# Patient Record
Sex: Male | Born: 2000 | Hispanic: Yes | Marital: Single | Smoking: Former smoker
Health system: Southern US, Community
[De-identification: ages and names within clinical notes are randomized; demographics above are authoritative.]

## PROBLEM LIST (undated history)

## (undated) DIAGNOSIS — T443X1A Poisoning by other parasympatholytics [anticholinergics and antimuscarinics] and spasmolytics, accidental (unintentional), initial encounter: Secondary | ICD-10-CM

## (undated) DIAGNOSIS — F419 Anxiety disorder, unspecified: Secondary | ICD-10-CM

## (undated) DIAGNOSIS — K219 Gastro-esophageal reflux disease without esophagitis: Secondary | ICD-10-CM

## (undated) HISTORY — PX: NO PAST SURGERIES: SHX2092

## (undated) HISTORY — PX: WISDOM TOOTH EXTRACTION: SHX21

## (undated) HISTORY — DX: Anxiety disorder, unspecified: F41.9

---
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
## 2020-07-21 IMAGING — CT CT HEAD W/O CM
2 series · 15 of 30 positions shown, 17 images · non-contrast
Comparison: None.

CLINICAL DATA: Patient with history of seizure.

EXAM:
CT HEAD WITHOUT CONTRAST
TECHNIQUE: Contiguous axial images were obtained from the base of the skull
through the vertex without intravenous contrast.

[Series 3: head wo · axial · 0.44mm/px · z∈[-147,-22]mm · 7 of 35 slices shown, 9 images]
[im 5/35  brain]
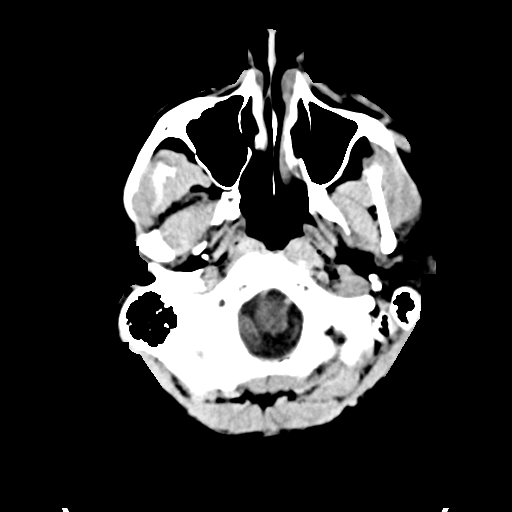
[im 5/35  bone]
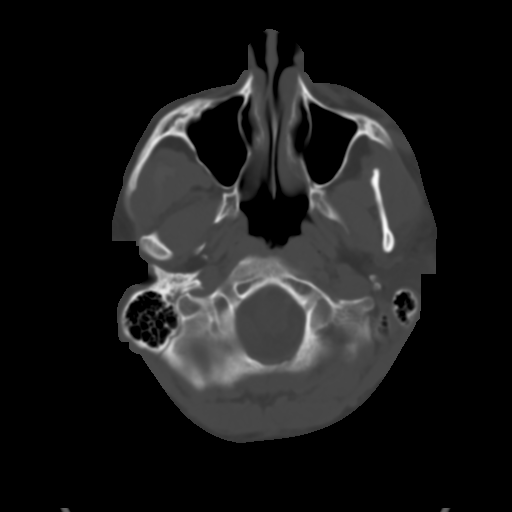
[im 9/35  brain]
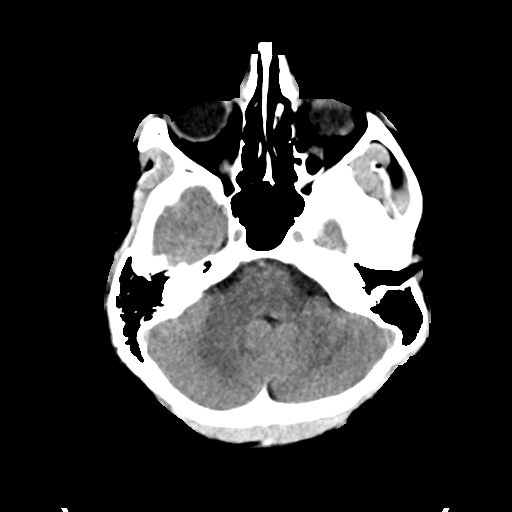
[im 13/35  brain]
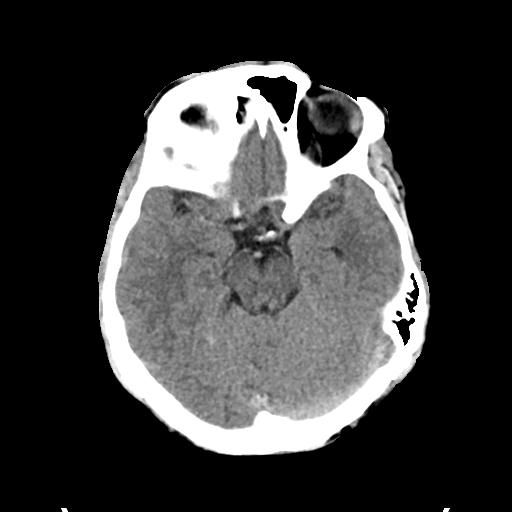
[im 18/35  brain]
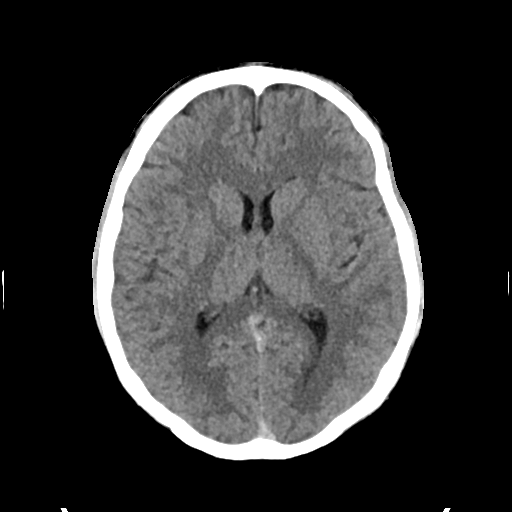
[im 22/35  brain]
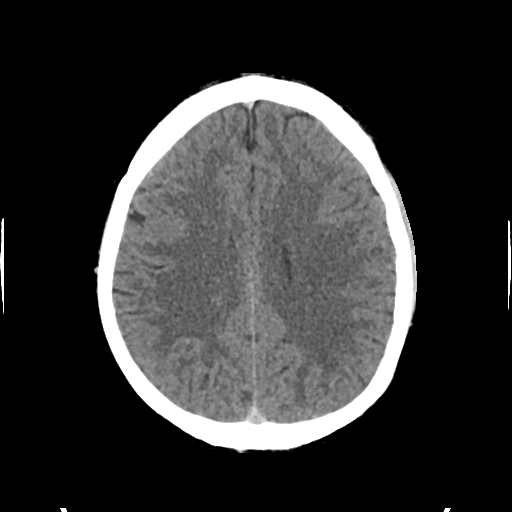
[im 22/35  bone]
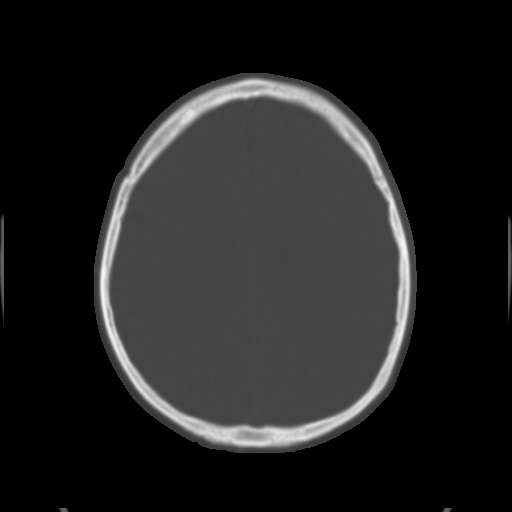
[im 26/35  brain]
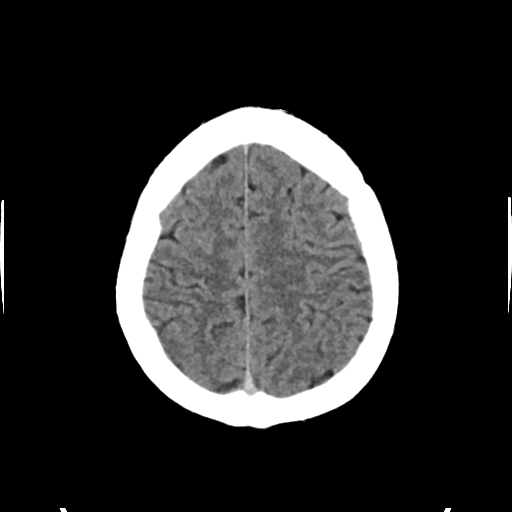
[im 30/35  brain]
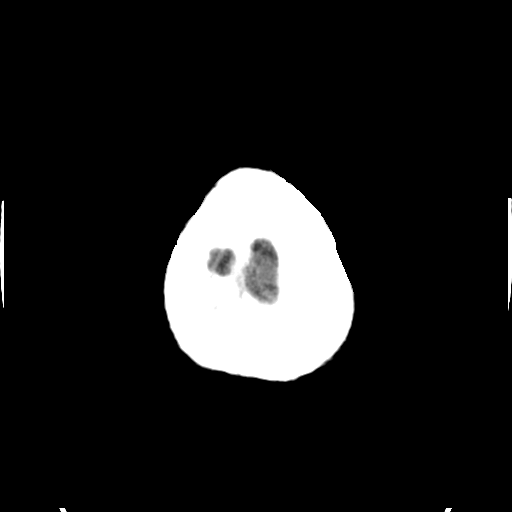

[Series 4: head bone · axial · 0.44mm/px · z∈[-151,-15]mm · 8 of 86 slices shown]
[im 9/86  bone]
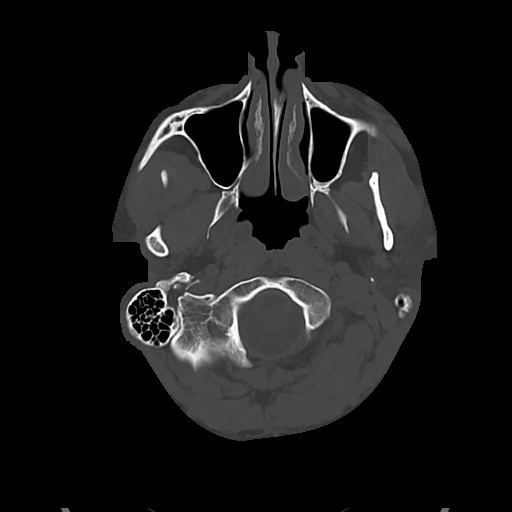
[im 18/86  bone]
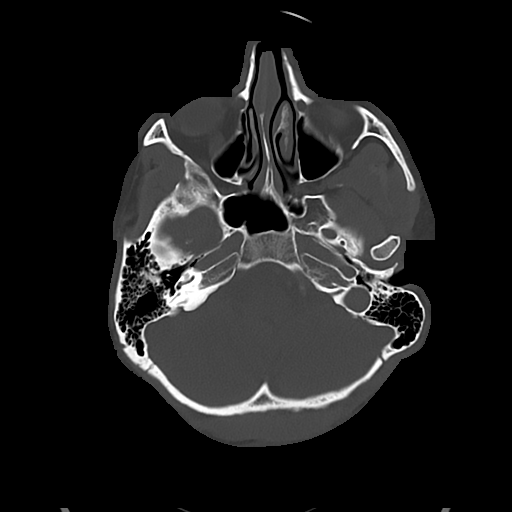
[im 26/86  bone]
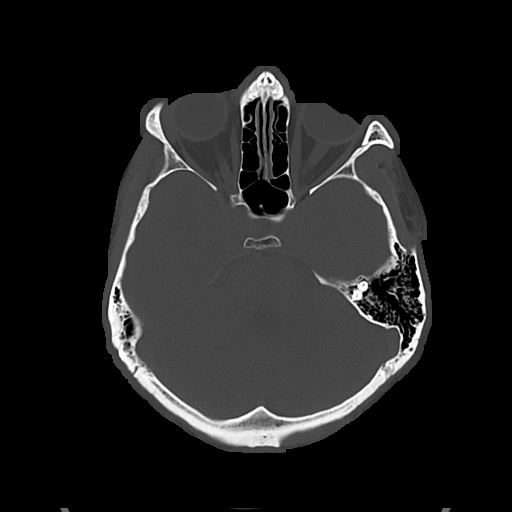
[im 39/86  bone]
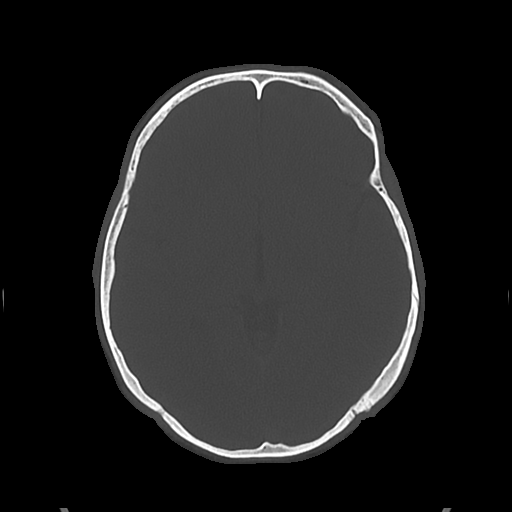
[im 47/86  bone]
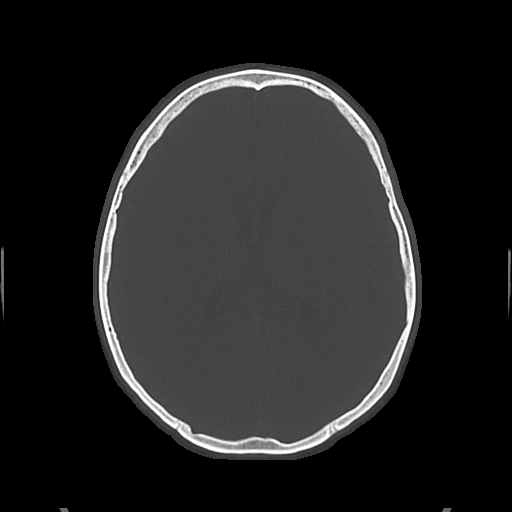
[im 60/86  bone]
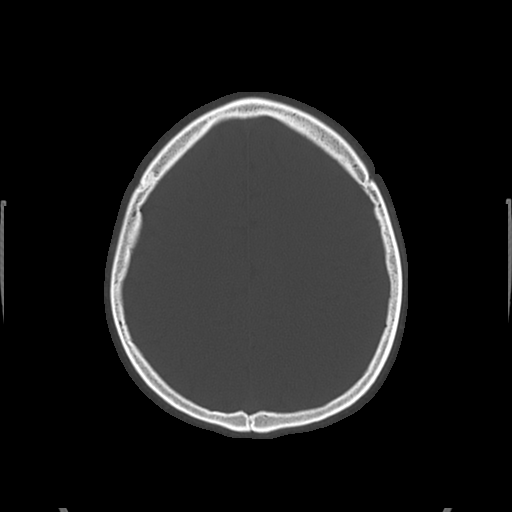
[im 69/86  bone]
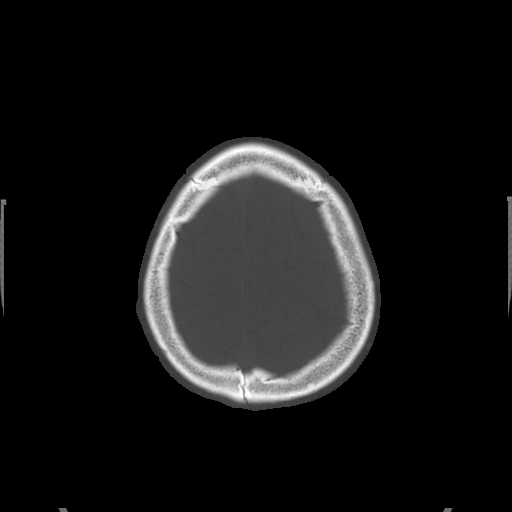
[im 77/86  bone]
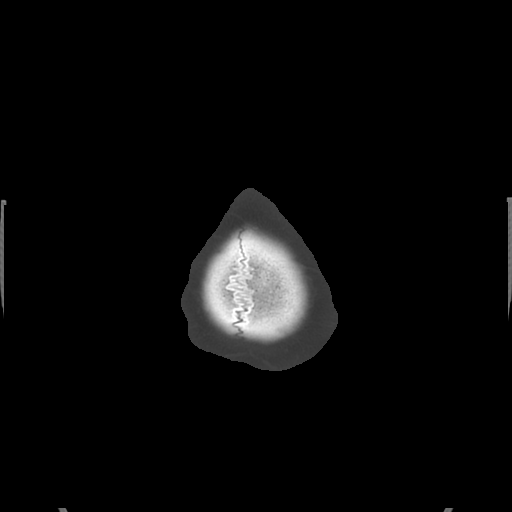

[15 of 30 positions shown; findings below may reference images not displayed]

FINDINGS: Brain: Ventricles and sulci are appropriate for patient's age. No
evidence for acute cortically based infarct, intracranial
hemorrhage, mass lesion or mass-effect.

Vascular: Unremarkable

Skull: Intact.

Sinuses/Orbits: Paranasal sinuses are well aerated. Mastoid air
cells are unremarkable. Orbits are unremarkable.

Other: None.
IMPRESSION: No acute intracranial process.

## 2020-07-21 IMAGING — MR MR HEAD W/O CM
11 of 12 series · 43 of 48 positions shown · non-contrast
Comparison: Head CT same day.

CLINICAL DATA: Seizure.  Confusion and agitation.

EXAM:
MRI HEAD WITHOUT CONTRAST
TECHNIQUE: Multiplanar, multiecho pulse sequences of the brain and surrounding
structures were obtained without intravenous contrast.

[Series 5: DWI · axial · 3.0mm · 0.88mm/px · z∈[-146,-3]mm · 4 of 49 slices shown (1 of 4)]
[im 1/49]
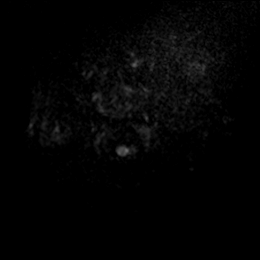
[im 17/49]
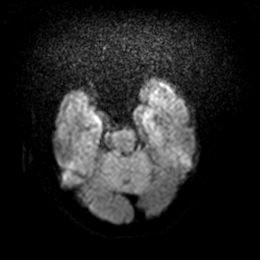
[im 33/49]
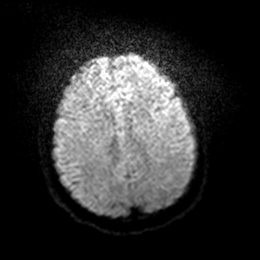
[im 49/49]
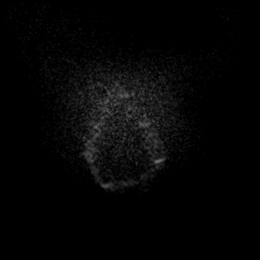

[Series 6: DWI · axial · 3.0mm · 0.88mm/px · z∈[-146,-3]mm · 5 of 49 slices shown (2 of 4)]
[im 1/49]
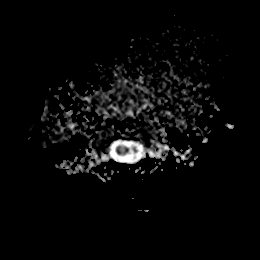
[im 13/49]
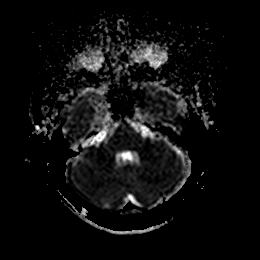
[im 25/49]
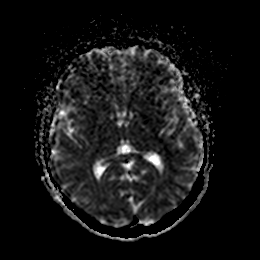
[im 37/49]
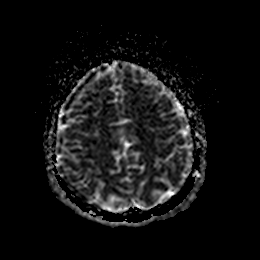
[im 49/49]
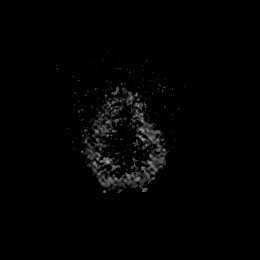

[Series 7: DWI · coronal · 4.0mm · 0.88mm/px · 4 of 35 slices shown (3 of 4)]
[im 1/35]
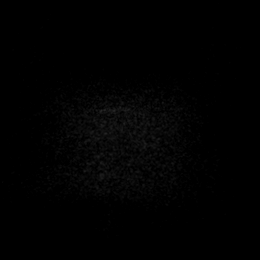
[im 12/35]
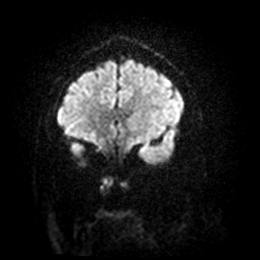
[im 23/35]
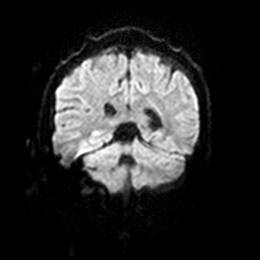
[im 35/35]
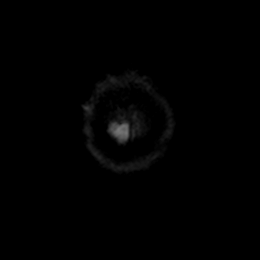

[Series 8: DWI · coronal · 4.0mm · 0.88mm/px · 4 of 35 slices shown (4 of 4)]
[im 1/35]
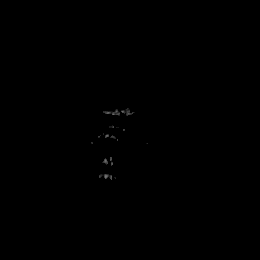
[im 12/35]
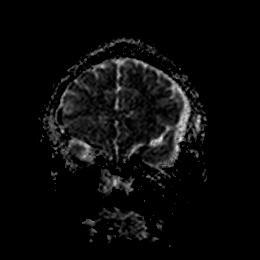
[im 23/35]
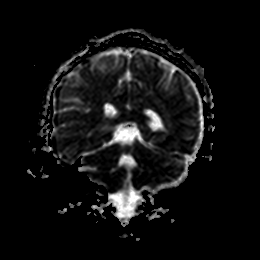
[im 35/35]
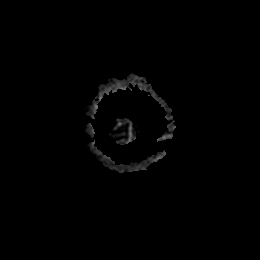

[Series 9: T1 · sagittal · 5.0mm · 0.75mm/px · 2 of 23 slices shown]
[im 1/23]
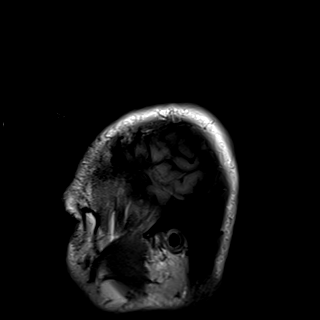
[im 23/23]
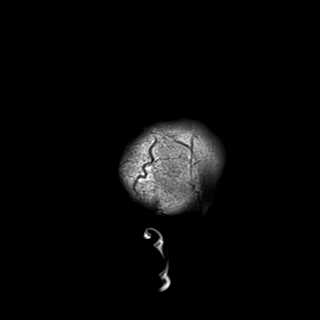

[Series 10: T2 · axial · 5.0mm · 0.72mm/px · z∈[-146,-3]mm · 3 of 25 slices shown (1 of 2)]
[im 1/25]
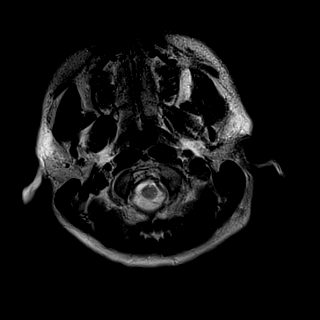
[im 13/25]
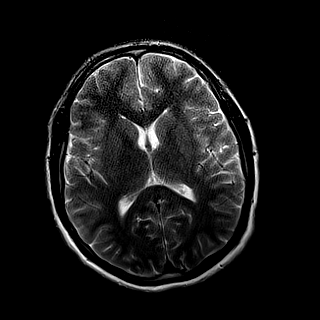
[im 25/25]
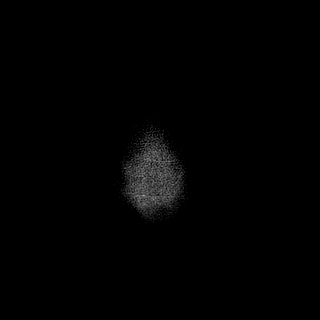

[Series 11: FLAIR · axial · 5.0mm · 0.45mm/px · z∈[-146,-3]mm · 3 of 25 slices shown]
[im 1/25]
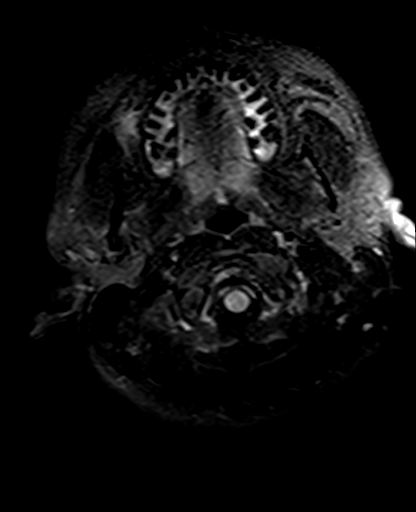
[im 13/25]
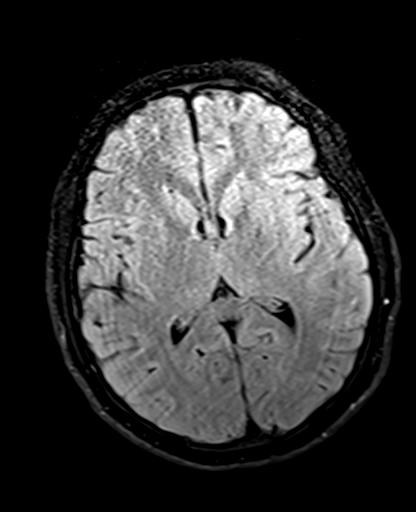
[im 25/25]
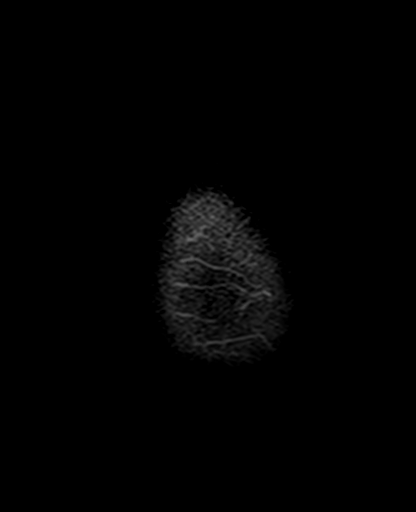

[Series 12: mag_images · axial · 3.0mm · 0.90mm/px · z∈[-151,+1]mm · 5 of 52 slices shown]
[im 1/52]
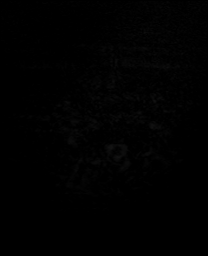
[im 13/52]
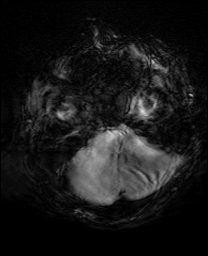
[im 26/52]
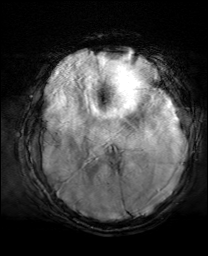
[im 39/52]
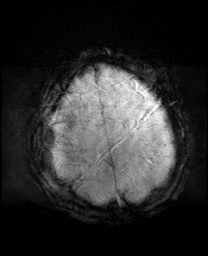
[im 52/52]
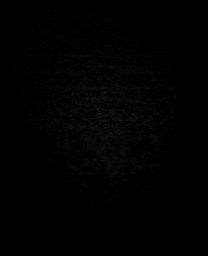

[Series 14: swi_images · axial · 3.0mm · 0.90mm/px · z∈[-151,+1]mm · 5 of 52 slices shown]
[im 1/52]
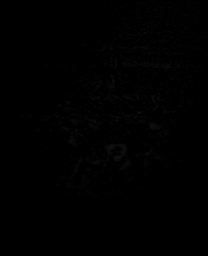
[im 13/52]
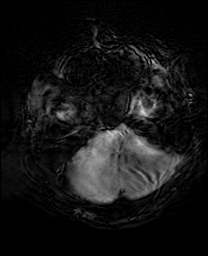
[im 26/52]
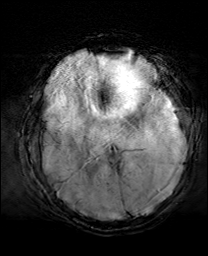
[im 39/52]
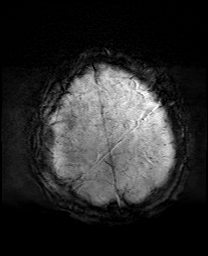
[im 52/52]
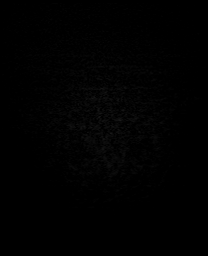

[Series 15: mip_images(sw) · axial · 24.0mm · 0.90mm/px · z∈[-140,-9]mm · 5 of 45 slices shown]
[im 1/45]
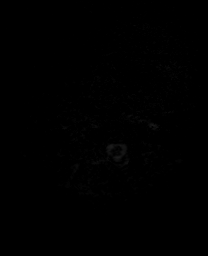
[im 12/45]
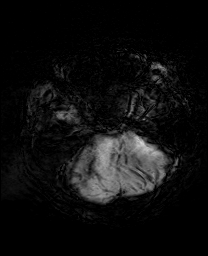
[im 23/45]
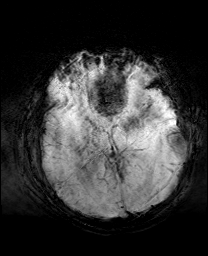
[im 34/45]
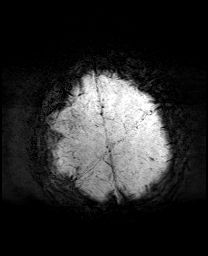
[im 45/45]
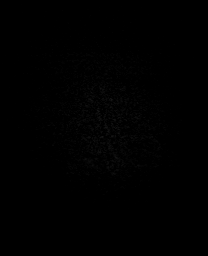

[Series 16: T2 · axial · 5.0mm · 0.90mm/px · z∈[-144,-1]mm · 3 of 25 slices shown (2 of 2)]
[im 1/25]
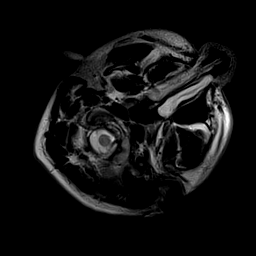
[im 13/25]
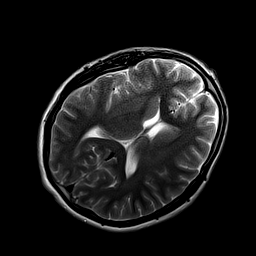
[im 25/25]
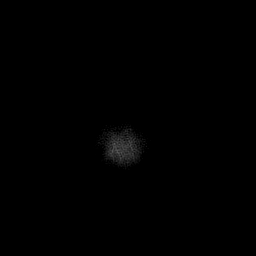

[43 of 48 positions shown; findings below may reference images not displayed]

FINDINGS: Brain: The study suffers from motion degradation. Diffusion imaging
does not show any acute or subacute infarction or other cause of
restricted diffusion. The brain appears normally formed. No sign of
infarction, mass, hemorrhage, hydrocephalus or extra-axial
collection.

Vascular: Major vessels at the base of the brain show flow.

Skull and upper cervical spine: Negative

Sinuses/Orbits: Clear/normal

Other: None
IMPRESSION: Markedly motion degraded exam.  No abnormality visible.

## 2022-12-14 ENCOUNTER — Emergency Department (HOSPITAL_COMMUNITY)
Admission: EM | Admit: 2022-12-14 | Discharge: 2022-12-14 | Disposition: A | Discharge status: Home / Self Care | Payer: Medicaid Other | Attending: Emergency Medicine | Admitting: Emergency Medicine

## 2022-12-14 ENCOUNTER — Encounter (HOSPITAL_COMMUNITY): Payer: Self-pay

## 2022-12-14 ENCOUNTER — Other Ambulatory Visit: Payer: Self-pay

## 2022-12-14 DIAGNOSIS — M549 Dorsalgia, unspecified: Secondary | ICD-10-CM | POA: Diagnosis present

## 2022-12-14 DIAGNOSIS — G8929 Other chronic pain: Secondary | ICD-10-CM | POA: Insufficient documentation

## 2022-12-14 DIAGNOSIS — M546 Pain in thoracic spine: Secondary | ICD-10-CM | POA: Diagnosis not present

## 2022-12-14 MED ORDER — NAPROXEN 500 MG PO TABS: 500.0000 mg | ORAL_TABLET | Freq: Two times a day (BID) | ORAL | 0 refills | Status: DC

## 2022-12-14 MED ORDER — KETOROLAC TROMETHAMINE 15 MG/ML IJ SOLN
15.0000 mg | Freq: Once | INTRAMUSCULAR | Status: AC
  Administered 2022-12-14: 15 mg via INTRAMUSCULAR
  Filled 2022-12-14: qty 1

## 2022-12-14 MED ORDER — LIDOCAINE 5 % EX PTCH
1.0000 | MEDICATED_PATCH | CUTANEOUS | Status: DC
  Administered 2022-12-14: 1 via TRANSDERMAL
  Filled 2022-12-14: qty 1

## 2022-12-14 MED ORDER — CYCLOBENZAPRINE HCL 10 MG PO TABS: 10.0000 mg | ORAL_TABLET | Freq: Two times a day (BID) | ORAL | 0 refills | Status: DC | PRN

## 2022-12-14 NOTE — ED Provider Notes (Signed)
[place] EMERGENCY DEPARTMENT AT [facility] Provider Note   CSN: [id number] Arrival date & time: 12/14/22  0820     History  Chief Complaint  Patient presents with   Back Pain   Spasms    Kevin Schultz is a 22 y.o. male history of anxiety presents today for evaluation of back pain.  Pain is located in the mid back and radiates to the front below the rib cage.  States had a symptom has been going on for a couple months, usually once or twice a week.  Patient stated he has been trying Tylenol and ibuprofen with minimal relief.  He denies any fever, nausea vomiting, chest pain, shortness of breath, urinary retention, bowel incontinence, saddle anesthesia, distal weakness.  Denies IVDU.  Denies any hematuria.  States he used to work at [employer] where he had to do some heavy lifting but has quit that job for a few months now.  Denies any recent fall or trauma.   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 Authorizing Provider  escitalopram (LEXAPRO) 10 MG tablet Take 1 tablet (10 mg total) by mouth daily. 12/08/18   [name], NP  neomycin-polymyxin-hydrocortisone (CORTISPORIN) OTIC solution Apply 1-2 drops to toe after soaking twice a day 01/12/19   Lenn Sink, DPM      Allergies    Patient has no known allergies.    Review of Systems   Review of Systems  Musculoskeletal:  Positive for back pain.    Physical Exam Updated Vital Signs BP (!) 153/80 (BP Location: Right Arm)   Pulse 95   Temp 98.1 F (36.7 C)   Resp 16   Ht 5\' 5"  (1.651 m)   Wt 93 kg   SpO2 100%   BMI 34.11 kg/m  Physical Exam Vitals and nursing note reviewed.  Constitutional:      Appearance: Normal appearance.   HENT:     Head: Normocephalic and atraumatic.     Mouth/Throat:     Mouth: Mucous membranes are moist.  Eyes:     General: No scleral icterus. Cardiovascular:     Rate and Rhythm: Normal rate and regular rhythm.     Pulses: Normal pulses.     Heart sounds: Normal heart sounds.  Pulmonary:     Effort: Pulmonary effort is normal.     Breath sounds: Normal breath sounds.  Abdominal:     General: Abdomen is flat.     Palpations: Abdomen is soft.     Tenderness: There is no abdominal tenderness.  Musculoskeletal:        General: No deformity.     Comments: Tenderness to palpation to the paraspinal muscles of the thoracic spine.  No midline tenderness.  No abdominal tenderness.  Negative CVA tenderness.  Skin:    General: Skin is warm.     Findings: No rash.  Neurological:     General: No focal deficit present.     Mental Status: He is alert.  Psychiatric:        Mood and Affect: Mood normal.     ED Results / Procedures / Treatments   Labs (all labs ordered are listed,  but only abnormal results are displayed) Labs Reviewed - No data to display  EKG None  Radiology No results found.  Procedures Procedures    Medications Ordered in ED Medications - No data to display  ED Course/ Medical Decision Making/ A&P                                 Medical Decision Making  This patient presents to the ED for back pain, this involves an extensive number of treatment options, and is a complaint that carries with a high risk of complications and morbidity.  The differential diagnosis includes fracture, dislocation, musculoskeletal pain,.  This is not an exhaustive list.  Problem list/ ED course/ Critical interventions/ Medical management: HPI: See above Vital signs within normal range and stable throughout visit. Laboratory/imaging studies significant for: See above. On physical examination, patient is afebrile and appears in no acute distress. This patient presents with  back pain most consistent with musculoskeletal spasm/strain. No back pain red flags on history or physical. Presentation not consistent with malignancy (lack of history of malignancy, lack of B symptoms), fracture (no trauma, no bony tenderness to palpation), transverse myelitis, (no sensory loss, no distal weakness), thoracic aortic dissection (equal peripheral pulses, no tachycardia, story does not fit), pneumonia (afebrile, no infectious symptoms), pulmonary embolism (patient is PERC negative), osteomyelitis or epidural abscess (no IVDU, vertebral tenderness). Given the clinical picture, no indication for imaging at this time.  Given Toradol and lidocaine patch for pain. Advised patient to take Tylenol/ibuprofen/naproxen for pain, follow-up with primary care physician for further evaluation and management, return to the ER if new or worsening symptoms. I have reviewed the patient home medicines and have made adjustments as needed.  Cardiac monitoring/EKG: The patient was maintained on a cardiac monitor.  I personally reviewed and interpreted the cardiac monitor which showed an underlying rhythm of: sinus rhythm.  Additional history obtained: External records from outside source obtained and reviewed including: Chart review including previous notes, labs, imaging.  Consultations obtained:  Disposition Continued outpatient therapy. Follow-up with PCP recommended for reevaluation of symptoms. Treatment plan discussed with patient.  Pt acknowledged understanding was agreeable to the plan. Worrisome signs and symptoms were discussed with patient, and patient acknowledged understanding to return to the ED if they noticed these signs and symptoms. Patient was stable upon discharge.   This chart was dictated using voice recognition software.  Despite best efforts to proofread,  errors can occur which can change the documentation meaning.          Final Clinical Impression(s) / ED Diagnoses Final  diagnoses:  Chronic bilateral thoracic back pain    Rx / DC Orders ED Discharge Orders          Ordered    naproxen (NAPROSYN) 500 MG tablet  2 times daily        12/14/22 0909    cyclobenzaprine (FLEXERIL) 10 MG tablet  2 times daily PRN        12/14/22 0909              Jeanelle Malling, PA 12/14/22 5284    Rolan Bucco, MD 12/15/22 [phone]

## 2022-12-14 NOTE — ED Triage Notes (Addendum)
Pt arrived POV from home c/o mid/lower back pain that wraps around both sides to his rib cage that has been on going for 6 hrs. Pt states this happens once a week and needs to be checked. Pt states he feels like he is having spasms.

## 2022-12-14 NOTE — Discharge Instructions (Addendum)
Please take your medications as prescribed.  Please avoid driving after taking flexeril as it can cause drowsiness.  Take tylenol/naproxen every 6 hour for pain. You can also use ice/heat for symptom relief. I recommend close follow-up with PCP for reevaluation.  Please do not hesitate to return to emergency department if worrisome signs symptoms we discussed become apparent.

## 2023-02-07 ENCOUNTER — Other Ambulatory Visit: Payer: Self-pay

## 2023-02-07 ENCOUNTER — Emergency Department (HOSPITAL_COMMUNITY)
Admission: EM | Admit: 2023-02-07 | Discharge: 2023-02-08 | Disposition: A | Discharge status: Home / Self Care | Payer: Medicaid Other | Attending: Emergency Medicine | Admitting: Emergency Medicine

## 2023-02-07 ENCOUNTER — Encounter (HOSPITAL_COMMUNITY): Payer: Self-pay | Admitting: *Deleted

## 2023-02-07 DIAGNOSIS — K226 Gastro-esophageal laceration-hemorrhage syndrome: Secondary | ICD-10-CM | POA: Diagnosis not present

## 2023-02-07 DIAGNOSIS — R112 Nausea with vomiting, unspecified: Secondary | ICD-10-CM

## 2023-02-07 DIAGNOSIS — K920 Hematemesis: Secondary | ICD-10-CM | POA: Diagnosis present

## 2023-02-07 LAB — ETHANOL: Alcohol, Ethyl (B): 242 mg/dL — ABNORMAL HIGH (ref <10)

## 2023-02-07 LAB — URINALYSIS, ROUTINE W REFLEX MICROSCOPIC
Bilirubin Urine: NEGATIVE
Glucose, UA: NEGATIVE mg/dL
Hgb urine dipstick: NEGATIVE
Ketones, ur: NEGATIVE mg/dL
Leukocytes,Ua: NEGATIVE
Nitrite: NEGATIVE
Protein, ur: NEGATIVE mg/dL
Specific Gravity, Urine: 1.003 — ABNORMAL LOW (ref 1.005–1.030)
pH: 6 (ref 5.0–8.0)

## 2023-02-07 LAB — CBC
HCT: 44.2 % (ref 39.0–52.0)
Hemoglobin: 15.1 g/dL (ref 13.0–17.0)
MCH: 32 pg (ref 26.0–34.0)
MCHC: 34.2 g/dL (ref 30.0–36.0)
MCV: 93.6 fL (ref 80.0–100.0)
Platelets: 406 10*3/uL — ABNORMAL HIGH (ref 150–400)
RBC: 4.72 MIL/uL (ref 4.22–5.81)
RDW: 12.5 % (ref 11.5–15.5)
WBC: 14 10*3/uL — ABNORMAL HIGH (ref 4.0–10.5)
nRBC: 0 % (ref 0.0–0.2)

## 2023-02-07 LAB — COMPREHENSIVE METABOLIC PANEL
ALT: 14 U/L (ref 0–44)
AST: 21 U/L (ref 15–41)
Albumin: 4.7 g/dL (ref 3.5–5.0)
Alkaline Phosphatase: 85 U/L (ref 38–126)
Anion gap: 10 (ref 5–15)
BUN: 9 mg/dL (ref 6–20)
CO2: 26 mmol/L (ref 22–32)
Calcium: 9 mg/dL (ref 8.9–10.3)
Chloride: 100 mmol/L (ref 98–111)
Creatinine, Ser: 1.05 mg/dL (ref 0.61–1.24)
GFR, Estimated: >60 mL/min (ref >60)
Glucose, Bld: 110 mg/dL — ABNORMAL HIGH (ref 70–99)
Potassium: 4.1 mmol/L (ref 3.5–5.1)
Sodium: 136 mmol/L (ref 135–145)
Total Bilirubin: 0.4 mg/dL (ref 0.3–1.2)
Total Protein: 7.8 g/dL (ref 6.5–8.1)

## 2023-02-07 LAB — LIPASE, BLOOD: Lipase: 24 U/L (ref 11–51)

## 2023-02-07 MED ORDER — ONDANSETRON HCL 4 MG/2ML IJ SOLN
4.0000 mg | Freq: Once | INTRAMUSCULAR | Status: AC
  Administered 2023-02-08: 4 mg via INTRAVENOUS
  Filled 2023-02-07: qty 2

## 2023-02-07 MED ORDER — PANTOPRAZOLE SODIUM 40 MG IV SOLR
40.0000 mg | Freq: Once | INTRAVENOUS | Status: AC
  Administered 2023-02-08: 40 mg via INTRAVENOUS
  Filled 2023-02-07: qty 10

## 2023-02-07 NOTE — ED Provider Notes (Signed)
[name] EMERGENCY DEPARTMENT AT [facility] Provider Note   CSN: [id number] Arrival date & time: 02/07/23  2156     History {Add pertinent medical, surgical, social history, OB history to HPI:1} Chief Complaint  Patient presents with   vomited blood    Kevin Schultz is a 22 y.o. male.  Reports vomiting after drinking alcohol.  Patient reports that there was red blood in his vomit.  He reports that he has had multiple episodes of vomiting throughout the day.  No melanotic stools.  He is not experiencing any abdominal pain.  Patient denies daily alcohol use but he has been drinking heavily for the last 3 or 4 days.       Home Medications Prior to Admission medications   Medication Sig Start Date End Date Taking? Authorizing Provider  cyclobenzaprine (FLEXERIL) 10 MG tablet Take 1 tablet (10 mg total) by mouth 2 (two) times daily as needed for muscle spasms. 12/14/22   Jeanelle Malling, PA  escitalopram (LEXAPRO) 10 MG tablet Take 1 tablet (10 mg total) by mouth daily. 12/08/18   [name], NP  naproxen (NAPROSYN) 500 MG tablet Take 1 tablet (500 mg total) by mouth 2 (two) times daily. 12/14/22   Jeanelle Malling, PA  neomycin-polymyxin-hydrocortisone (CORTISPORIN) OTIC solution Apply 1-2 drops to toe after soaking twice a day 01/12/19   Lenn Sink, DPM      Allergies    Patient has no known allergies.    Review of Systems   Review of Systems  Physical Exam Updated Vital Signs BP 128/84 (BP Location: Right Arm)   Pulse (!) 112   Temp 98.6 F (37 C) (Oral)   Resp 20   Ht 5\' 5"  (1.651 m)   Wt 93 kg   SpO2 100%   BMI 34.12 kg/m  Physical Exam Vitals and nursing note reviewed.  Constitutional:      General: He is not in acute distress.    Appearance: He is well-developed.  HENT:     Head: Normocephalic and atraumatic.     Mouth/Throat:     Mouth: Mucous membranes are moist.  Eyes:     General: Vision grossly intact. Gaze aligned appropriately.      Extraocular Movements: Extraocular movements intact.     Conjunctiva/sclera: Conjunctivae normal.  Cardiovascular:     Rate and Rhythm: Regular rhythm. Tachycardia present.     Pulses: Normal pulses.     Heart sounds: Normal heart sounds, S1 normal and S2 normal. No murmur heard.    No friction rub. No gallop.  Pulmonary:     Effort: Pulmonary effort is normal. No respiratory distress.     Breath sounds: Normal breath sounds.  Abdominal:     Palpations: Abdomen is soft.     Tenderness: There is no abdominal tenderness. There is no guarding or rebound.     Hernia: No hernia is present.  Musculoskeletal:        General: No swelling.     Cervical back: Full passive range of motion without pain, normal range of motion and neck supple. No pain with movement, spinous process tenderness or muscular tenderness. Normal range of motion.     Right lower leg: No edema.     Left lower leg: No edema.  Skin:    General: Skin is warm and dry.     Capillary Refill: Capillary refill takes less than 2 seconds.     Findings: No ecchymosis, erythema, lesion or wound.  Neurological:  Mental Status: He is alert and oriented to person, place, and time.     GCS: GCS eye subscore is 4. GCS verbal subscore is 5. GCS motor subscore is 6.     Cranial Nerves: Cranial nerves 2-12 are intact.     Sensory: Sensation is intact.     Motor: Motor function is intact. No weakness or abnormal muscle tone.     Coordination: Coordination is intact.  Psychiatric:        Mood and Affect: Mood normal.        Speech: Speech normal.        Behavior: Behavior normal.     ED Results / Procedures / Treatments   Labs (all labs ordered are listed, but only abnormal results are displayed) Labs Reviewed  COMPREHENSIVE METABOLIC PANEL - Abnormal; Notable for the following components:      Result Value   Glucose, Bld 110 (*)    All other components within normal limits  CBC - Abnormal; Notable for the following components:    WBC 14.0 (*)    Platelets 406 (*)    All other components within normal limits  URINALYSIS, ROUTINE W REFLEX MICROSCOPIC - Abnormal; Notable for the following components:   Color, Urine COLORLESS (*)    Specific Gravity, Urine 1.003 (*)    All other components within normal limits  ETHANOL - Abnormal; Notable for the following components:   Alcohol, Ethyl (B) 242 (*)    All other components within normal limits  LIPASE, BLOOD    EKG EKG Interpretation Date/Time:  Saturday February 07 2023 22:12:48 EDT Ventricular Rate:  101 PR Interval:  146 QRS Duration:  88 QT Interval:  302 QTC Calculation: 391 R Axis:   66  Text Interpretation: Sinus tachycardia Minimal voltage criteria for LVH, may be normal variant ( R in aVL ) Possible Inferior infarct , age undetermined Cannot rule out Anterior infarct , age undetermined Abnormal ECG When compared with ECG of 14-May-2019 05:05, No acute changes Confirmed by Gilda Crease [phone]) on 02/07/2023 11:38:58 PM  Radiology No results found.  Procedures Procedures  {Document cardiac monitor, telemetry assessment procedure when appropriate:1}  Medications Ordered in ED Medications - No data to display  ED Course/ Medical Decision Making/ A&P   {   Click here for ABCD2, HEART and other calculatorsREFRESH Note before signing :1}                              Medical Decision Making Risk Prescription drug management.   ***  {Document critical care time when appropriate:1} {Document review of labs and clinical decision tools ie heart score, Chads2Vasc2 etc:1}  {Document your independent review of radiology images, and any outside records:1} {Document your discussion with family members, caretakers, and with consultants:1} {Document social determinants of health affecting pt's care:1} {Document your decision making why or why not admission, treatments were needed:1} Final Clinical Impression(s) / ED Diagnoses Final diagnoses:   None    Rx / DC Orders ED Discharge Orders     None

## 2023-02-07 NOTE — ED Triage Notes (Signed)
He had his  last alcohol one hour ago

## 2023-02-07 NOTE — ED Triage Notes (Signed)
Earlier today  he vomited blood after drinking alcohol and he has been having back pain which he takes a muscle relaxer

## 2023-02-07 NOTE — ED Notes (Signed)
ED Provider at bedside. 

## 2023-02-08 MED ORDER — ONDANSETRON 4 MG PO TBDP: ORAL_TABLET | ORAL | 0 refills | Status: DC

## 2023-02-08 MED ORDER — FAMOTIDINE 20 MG PO TABS: 20.0000 mg | ORAL_TABLET | Freq: Two times a day (BID) | ORAL | 0 refills | Status: DC

## 2023-02-08 NOTE — ED Notes (Signed)
Pt more awake and arousable. Preparing pt for d/c.

## 2023-02-08 NOTE — Discharge Instructions (Signed)
Avoid alcohol.  Take the medications as prescribed.  Return for worsening symptoms.

## 2023-04-06 ENCOUNTER — Emergency Department (HOSPITAL_COMMUNITY)
Admission: EM | Admit: 2023-04-06 | Discharge: 2023-04-06 | Disposition: A | Discharge status: Home / Self Care | Payer: Medicaid Other | Attending: Emergency Medicine | Admitting: Emergency Medicine

## 2023-04-06 ENCOUNTER — Encounter (HOSPITAL_COMMUNITY): Payer: Self-pay

## 2023-04-06 ENCOUNTER — Other Ambulatory Visit: Payer: Self-pay

## 2023-04-06 DIAGNOSIS — M62830 Muscle spasm of back: Secondary | ICD-10-CM | POA: Insufficient documentation

## 2023-04-06 MED ORDER — LIDOCAINE 5 % EX PTCH
1.0000 | MEDICATED_PATCH | CUTANEOUS | Status: DC
  Administered 2023-04-06: 1 via TRANSDERMAL
  Filled 2023-04-06: qty 1

## 2023-04-06 MED ORDER — KETOROLAC TROMETHAMINE 15 MG/ML IJ SOLN
15.0000 mg | Freq: Once | INTRAMUSCULAR | Status: AC
  Administered 2023-04-06: 15 mg via INTRAMUSCULAR
  Filled 2023-04-06: qty 1

## 2023-04-06 MED ORDER — DIAZEPAM 2 MG PO TABS
2.0000 mg | ORAL_TABLET | Freq: Once | ORAL | Status: AC
  Administered 2023-04-06: 2 mg via ORAL
  Filled 2023-04-06: qty 1

## 2023-04-06 NOTE — Discharge Instructions (Signed)
Continue taking Flexeril as needed.  Return for any concerning symptoms.  Follow-up with your primary care provider.

## 2023-04-06 NOTE — ED Triage Notes (Addendum)
Patient said about 5 hours ago he began having lower back spasms. Tried taking flexeril. Still having pain. No falls. Patient said he lifts heavy weights at the gym and possibly injured his back muscle.

## 2023-04-06 NOTE — ED Provider Notes (Signed)
[place] EMERGENCY DEPARTMENT AT [facility] Provider Note   CSN: [id number] Arrival date & time: 04/06/23  0813     History  Chief Complaint  Patient presents with   Back Spasms    Kevin Schultz is a 22 y.o. male.  22 year old male presents today for concern of muscle spasm to his back.  He states it has happened in the past.  Feels similar to previous episodes.  He took Flexeril at home without improvement.  He does lift weights at the gym.  He denies any bowel or bladder dysfunction.  No fever.  No direct trauma.  The history is provided by the patient. No language interpreter was used.       Home Medications Prior to Admission medications   Medication Sig Start Date End Date Taking? Authorizing Provider  cyclobenzaprine (FLEXERIL) 10 MG tablet Take 1 tablet (10 mg total) by mouth 2 (two) times daily as needed for muscle spasms. 12/14/22   Jeanelle Malling, PA  escitalopram (LEXAPRO) 10 MG tablet Take 1 tablet (10 mg total) by mouth daily. 12/08/18   [name], NP  famotidine (PEPCID) 20 MG tablet Take 1 tablet (20 mg total) by mouth 2 (two) times daily. 02/08/23   Gilda Crease, MD  naproxen (NAPROSYN) 500 MG tablet Take 1 tablet (500 mg total) by mouth 2 (two) times daily. 12/14/22   Jeanelle Malling, PA  neomycin-polymyxin-hydrocortisone (CORTISPORIN) OTIC solution Apply 1-2 drops to toe after soaking twice a day 01/12/19   Lenn Sink, DPM  ondansetron (ZOFRAN-ODT) 4 MG disintegrating tablet 4mg  ODT q4 hours prn nausea/vomit 02/08/23   Pollina, Canary Brim, MD      Allergies    Patient has no known allergies.    Review of Systems   Review of Systems  Constitutional:  Negative for fever.  Genitourinary:  Negative for difficulty urinating.  Musculoskeletal:  Positive for back pain.  All other systems reviewed and are negative.   Physical Exam Updated Vital Signs BP (!) 149/90   Pulse (!) 104   Temp 97.8 F (36.6 C) (Oral)   Resp 18   Ht  5\' 6"  (1.676 m)   Wt 90.7 kg   SpO2 100%   BMI 32.28 kg/m  Physical Exam Vitals and nursing note reviewed.  Constitutional:      General: He is not in acute distress.    Appearance: Normal appearance. He is not ill-appearing.  HENT:     Head: Normocephalic and atraumatic.     Nose: Nose normal.  Eyes:     Conjunctiva/sclera: Conjunctivae normal.  Pulmonary:     Effort: Pulmonary effort is normal. No respiratory distress.  Musculoskeletal:        General: No deformity.  Skin:    Findings: No rash.  Neurological:     Mental Status: He is alert.     ED Results / Procedures / Treatments   Labs (all labs ordered are listed, but only abnormal results are displayed) Labs Reviewed - No data to display  EKG None  Radiology No results found.  Procedures Procedures    Medications Ordered in ED Medications  diazepam (VALIUM) tablet 2 mg (has no administration in time range)  ketorolac (TORADOL) 15 MG/ML injection 15 mg (has no administration in time range)  lidocaine (LIDODERM) 5 % 1 patch (has no administration in time range)    ED Course/ Medical Decision Making/ A&P  Medical Decision Making Risk Prescription drug management.   70 male presents today for concern of mid back pain that started after working out.  He states that started about 5 hours ago.  Does have history of muscle spasms.  This feels similar.  No red flag signs or symptoms on exam or history that would suggest cauda equina syndrome or spinal epidural abscess.  Will give multimodal pain control and reevaluate.  Patient's improved on reevaluation.  Appropriate for discharge.  Discharged in stable.  Return precaution discussed.  Patient voices understanding and is in agreement with plan.  Final Clinical Impression(s) / ED Diagnoses Final diagnoses:  Muscle spasm of back    Rx / DC Orders ED Discharge Orders     None         Marita Kansas, PA-C 04/06/23  1504    Ernie Avena, MD 04/06/23 1506

## 2023-04-07 ENCOUNTER — Emergency Department (HOSPITAL_COMMUNITY)
Admission: EM | Admit: 2023-04-07 | Discharge: 2023-04-07 | Disposition: A | Discharge status: Home / Self Care | Payer: Medicaid Other | Attending: Emergency Medicine | Admitting: Emergency Medicine

## 2023-04-07 DIAGNOSIS — G8929 Other chronic pain: Secondary | ICD-10-CM

## 2023-04-07 DIAGNOSIS — M62830 Muscle spasm of back: Secondary | ICD-10-CM | POA: Insufficient documentation

## 2023-04-07 LAB — BASIC METABOLIC PANEL
Anion gap: 9 (ref 5–15)
BUN: 15 mg/dL (ref 6–20)
CO2: 25 mmol/L (ref 22–32)
Calcium: 9.3 mg/dL (ref 8.9–10.3)
Chloride: 103 mmol/L (ref 98–111)
Creatinine, Ser: 0.81 mg/dL (ref 0.61–1.24)
GFR, Estimated: >60 mL/min (ref >60)
Glucose, Bld: 113 mg/dL — ABNORMAL HIGH (ref 70–99)
Potassium: 4.8 mmol/L (ref 3.5–5.1)
Sodium: 137 mmol/L (ref 135–145)

## 2023-04-07 LAB — CK: Total CK: 276 U/L (ref 49–397)

## 2023-04-07 MED ORDER — DIAZEPAM 5 MG PO TABS
5.0000 mg | ORAL_TABLET | Freq: Once | ORAL | Status: AC
  Administered 2023-04-07: 5 mg via ORAL
  Filled 2023-04-07: qty 1

## 2023-04-07 MED ORDER — DIAZEPAM 2 MG PO TABS: 2.0000 mg | ORAL_TABLET | ORAL | 0 refills | Status: DC | PRN

## 2023-04-07 MED ORDER — OXYCODONE-ACETAMINOPHEN 5-325 MG PO TABS
1.0000 | ORAL_TABLET | Freq: Once | ORAL | Status: AC
  Administered 2023-04-07: 1 via ORAL
  Filled 2023-04-07: qty 1

## 2023-04-07 NOTE — ED Triage Notes (Signed)
Pt arrived via POV. C/o gen back and abd pain. Came yesterday for back pain, and states abd pain probably d/t bloating.  States muscle relaxers aren't working AOx4

## 2023-04-07 NOTE — Discharge Instructions (Addendum)
Use ibuprofen as directed also

## 2023-04-07 NOTE — ED Provider Notes (Signed)
[place] EMERGENCY DEPARTMENT AT [facility] Provider Note   CSN: [id number] Arrival date & time: 04/07/23  5284     History  Chief Complaint  Patient presents with   Back Pain   Abdominal Pain    Kevin Schultz is a 22 y.o. male.  22 year old male who presents with recurrent muscle spasms to his back which radiates to his abdomen.  Denies any fever or chills.  No nausea vomiting.  No diarrhea.  No urinary symptoms.  States that this has been going on for the past 4 months.  He denies any current physical activity.  Denies any change to his urine color.  No medications.  States the pain is a cramping worse with certain movements.  Denies any peripheral edema.  Has been using Flexeril with out relief.  Seen recently for same and placed on medications without relief       Home Medications Prior to Admission medications   Medication Sig Start Date End Date Taking? Authorizing Provider  cyclobenzaprine (FLEXERIL) 10 MG tablet Take 1 tablet (10 mg total) by mouth 2 (two) times daily as needed for muscle spasms. 12/14/22   Jeanelle Malling, PA  escitalopram (LEXAPRO) 10 MG tablet Take 1 tablet (10 mg total) by mouth daily. 12/08/18   [name], NP  famotidine (PEPCID) 20 MG tablet Take 1 tablet (20 mg total) by mouth 2 (two) times daily. 02/08/23   Gilda Crease, MD  naproxen (NAPROSYN) 500 MG tablet Take 1 tablet (500 mg total) by mouth 2 (two) times daily. 12/14/22   Jeanelle Malling, PA  neomycin-polymyxin-hydrocortisone (CORTISPORIN) OTIC solution Apply 1-2 drops to toe after soaking twice a day 01/12/19   Lenn Sink, DPM  ondansetron (ZOFRAN-ODT) 4 MG disintegrating tablet 4mg  ODT q4 hours prn nausea/vomit 02/08/23   Pollina, Canary Brim, MD      Allergies    Patient has no known allergies.    Review of Systems   Review of Systems  All other systems reviewed and are negative.   Physical Exam Updated Vital Signs BP (!) 175/89 (BP Location: Right Arm)    Pulse (!) 109   Temp 98.2 F (36.8 C) (Oral)   Resp 18   Ht 1.676 m (5\' 6" )   Wt 95.3 kg   SpO2 100%   BMI 33.89 kg/m  Physical Exam Vitals and nursing note reviewed.  Constitutional:      General: He is not in acute distress.    Appearance: Normal appearance. He is well-developed. He is not toxic-appearing.  HENT:     Head: Normocephalic and atraumatic.  Eyes:     General: Lids are normal.     Conjunctiva/sclera: Conjunctivae normal.     Pupils: Pupils are equal, round, and reactive to light.  Neck:     Thyroid: No thyroid mass.     Trachea: No tracheal deviation.  Cardiovascular:     Rate and Rhythm: Normal rate and regular rhythm.     Heart sounds: Normal heart sounds. No murmur heard.    No gallop.  Pulmonary:     Effort: Pulmonary effort is normal. No respiratory distress.     Breath sounds: Normal breath sounds. No stridor. No decreased breath sounds, wheezing, rhonchi or rales.  Abdominal:     General: There is no distension.     Palpations: Abdomen is soft.     Tenderness: There is no abdominal tenderness. There is no rebound.  Musculoskeletal:  General: No tenderness. Normal range of motion.     Cervical back: Normal range of motion and neck supple.       Back:  Skin:    General: Skin is warm and dry.     Findings: No abrasion or rash.  Neurological:     Mental Status: He is alert and oriented to person, place, and time. Mental status is at baseline.     GCS: GCS eye subscore is 4. GCS verbal subscore is 5. GCS motor subscore is 6.     Cranial Nerves: No cranial nerve deficit.     Sensory: No sensory deficit.     Motor: Motor function is intact.  Psychiatric:        Attention and Perception: Attention normal.        Speech: Speech normal.        Behavior: Behavior normal.     ED Results / Procedures / Treatments   Labs (all labs ordered are listed, but only abnormal results are displayed) Labs Reviewed - No data to  display  EKG None  Radiology No results found.  Procedures Procedures    Medications Ordered in ED Medications  diazepam (VALIUM) tablet 5 mg (has no administration in time range)  oxyCODONE-acetaminophen (PERCOCET/ROXICET) 5-325 MG per tablet 1 tablet (has no administration in time range)    ED Course/ Medical Decision Making/ A&P                                 Medical Decision Making Amount and/or Complexity of Data Reviewed Labs: ordered.  Risk Prescription drug management.   Patient medicated for pain here and feels better.  Labs are reassuring.  No evidence of rhabdo.  Renal function normal.  Will prescribe muscle axis and discharge        Final Clinical Impression(s) / ED Diagnoses Final diagnoses:  None    Rx / DC Orders ED Discharge Orders     None         Lorre Nick, MD 04/07/23 [phone]

## 2023-04-21 ENCOUNTER — Emergency Department (HOSPITAL_COMMUNITY): Payer: Medicaid Other

## 2023-04-21 ENCOUNTER — Emergency Department (HOSPITAL_COMMUNITY)
Admission: EM | Admit: 2023-04-21 | Discharge: 2023-04-21 | Disposition: A | Discharge status: Home / Self Care | Payer: Medicaid Other | Attending: Emergency Medicine | Admitting: Emergency Medicine

## 2023-04-21 ENCOUNTER — Encounter (HOSPITAL_COMMUNITY): Payer: Self-pay

## 2023-04-21 DIAGNOSIS — R112 Nausea with vomiting, unspecified: Secondary | ICD-10-CM | POA: Insufficient documentation

## 2023-04-21 DIAGNOSIS — D72829 Elevated white blood cell count, unspecified: Secondary | ICD-10-CM | POA: Insufficient documentation

## 2023-04-21 DIAGNOSIS — R1011 Right upper quadrant pain: Secondary | ICD-10-CM | POA: Diagnosis present

## 2023-04-21 LAB — URINALYSIS, ROUTINE W REFLEX MICROSCOPIC
Bacteria, UA: NONE SEEN
Bilirubin Urine: NEGATIVE
Glucose, UA: NEGATIVE mg/dL
Hgb urine dipstick: NEGATIVE
Ketones, ur: 20 mg/dL — AB
Leukocytes,Ua: NEGATIVE
Nitrite: NEGATIVE
Protein, ur: 30 mg/dL — AB
Specific Gravity, Urine: >1.046 — ABNORMAL HIGH (ref 1.005–1.030)
pH: 5 (ref 5.0–8.0)

## 2023-04-21 LAB — CBC WITH DIFFERENTIAL/PLATELET
Abs Immature Granulocytes: 0.03 10*3/uL (ref 0.00–0.07)
Basophils Absolute: 0.1 10*3/uL (ref 0.0–0.1)
Basophils Relative: 1 %
Eosinophils Absolute: 0.3 10*3/uL (ref 0.0–0.5)
Eosinophils Relative: 2 %
HCT: 43.9 % (ref 39.0–52.0)
Hemoglobin: 14.6 g/dL (ref 13.0–17.0)
Immature Granulocytes: 0 %
Lymphocytes Relative: 21 %
Lymphs Abs: 2.6 10*3/uL (ref 0.7–4.0)
MCH: 32.2 pg (ref 26.0–34.0)
MCHC: 33.3 g/dL (ref 30.0–36.0)
MCV: 96.9 fL (ref 80.0–100.0)
Monocytes Absolute: 0.8 10*3/uL (ref 0.1–1.0)
Monocytes Relative: 7 %
Neutro Abs: 8.9 10*3/uL — ABNORMAL HIGH (ref 1.7–7.7)
Neutrophils Relative %: 69 %
Platelets: 388 10*3/uL (ref 150–400)
RBC: 4.53 MIL/uL (ref 4.22–5.81)
RDW: 12.1 % (ref 11.5–15.5)
WBC: 12.7 10*3/uL — ABNORMAL HIGH (ref 4.0–10.5)
nRBC: 0 % (ref 0.0–0.2)

## 2023-04-21 LAB — COMPREHENSIVE METABOLIC PANEL
ALT: 19 U/L (ref 0–44)
AST: 24 U/L (ref 15–41)
Albumin: 4.6 g/dL (ref 3.5–5.0)
Alkaline Phosphatase: 78 U/L (ref 38–126)
Anion gap: 10 (ref 5–15)
BUN: 19 mg/dL (ref 6–20)
CO2: 24 mmol/L (ref 22–32)
Calcium: 9.6 mg/dL (ref 8.9–10.3)
Chloride: 101 mmol/L (ref 98–111)
Creatinine, Ser: 1.02 mg/dL (ref 0.61–1.24)
GFR, Estimated: >60 mL/min (ref >60)
Glucose, Bld: 107 mg/dL — ABNORMAL HIGH (ref 70–99)
Potassium: 3.9 mmol/L (ref 3.5–5.1)
Sodium: 135 mmol/L (ref 135–145)
Total Bilirubin: 0.4 mg/dL (ref 0.0–1.2)
Total Protein: 7.8 g/dL (ref 6.5–8.1)

## 2023-04-21 LAB — LIPASE, BLOOD: Lipase: 29 U/L (ref 11–51)

## 2023-04-21 MED ORDER — IOHEXOL 300 MG/ML  SOLN
100.0000 mL | Freq: Once | INTRAMUSCULAR | Status: AC | PRN
  Administered 2023-04-21: 100 mL via INTRAVENOUS

## 2023-04-21 MED ORDER — ONDANSETRON 4 MG PO TBDP
4.0000 mg | ORAL_TABLET | Freq: Once | ORAL | Status: AC
  Administered 2023-04-21: 4 mg via ORAL
  Filled 2023-04-21: qty 1

## 2023-04-21 MED ORDER — ONDANSETRON HCL 4 MG PO TABS: 4.0000 mg | ORAL_TABLET | Freq: Three times a day (TID) | ORAL | 0 refills | Status: DC | PRN

## 2023-04-21 MED ORDER — KETOROLAC TROMETHAMINE 15 MG/ML IJ SOLN
15.0000 mg | Freq: Once | INTRAMUSCULAR | Status: AC
  Administered 2023-04-21: 15 mg via INTRAVENOUS
  Filled 2023-04-21: qty 1

## 2023-04-21 NOTE — ED Triage Notes (Addendum)
 Pt presents with c/o abdominal pain, vomiting, and back pain that started around 3 am this morning. Pt does have a hx of back spasms that cause these symptoms. No relief with home meds of Pepto, muscle relaxer, ibuprofen.

## 2023-04-21 NOTE — ED Provider Notes (Signed)
 [place] [facility]MENT AT [facility] Provider Note   CSN: [id number] Arrival date & time: 04/21/23  9182     History  Chief Complaint  Patient presents with   Abdominal Pain    Kevin Schultz is a 23 y.o. male presents today for right upper quadrant abdominal pain, vomiting, and back spasm that started around 3 AM this morning.  Patient does have a history of back spasms that causes similar symptoms.  Patient has tried taking Pepto, muscle relaxer, and ibuprofen at home with no relief.  Patient denies diarrhea, blood in stool, fever, chills, shortness of breath, chest pain, or weakness.   Abdominal Pain Associated symptoms: nausea and vomiting        Home Medications Prior to Admission medications   Medication Sig Start Date End Date Taking? Authorizing Provider  ondansetron  (ZOFRAN ) 4 MG tablet Take 1 tablet (4 mg total) by mouth every 8 (eight) hours as needed for nausea or vomiting. 04/21/23  Yes Vaanya Shambaugh N, PA-C  cyclobenzaprine  (FLEXERIL ) 10 MG tablet Take 1 tablet (10 mg total) by mouth 2 (two) times daily as needed for muscle spasms. 12/14/22   Ladora Congress, PA  diazepam  (VALIUM ) 2 MG tablet Take 1 tablet (2 mg total) by mouth every 4 (four) hours as needed for anxiety. 04/07/23   Dasie Faden, MD  escitalopram  (LEXAPRO ) 10 MG tablet Take 1 tablet (10 mg total) by mouth daily. 12/08/18   Celestia Rosaline SQUIBB, NP  famotidine  (PEPCID ) 20 MG tablet Take 1 tablet (20 mg total) by mouth 2 (two) times daily. 02/08/23   Haze Lonni PARAS, MD  naproxen  (NAPROSYN ) 500 MG tablet Take 1 tablet (500 mg total) by mouth 2 (two) times daily. 12/14/22   Ladora Congress, PA  neomycin -polymyxin-hydrocortisone (CORTISPORIN) OTIC solution Apply 1-2 drops to toe after soaking twice a day 01/12/19   Magdalen Pasco RAMAN, DPM      Allergies    Patient has no known allergies.    Review of Systems   Review of Systems  Gastrointestinal:  Positive for abdominal pain, nausea and  vomiting.    Physical Exam Updated Vital Signs BP (!) 159/85 (BP Location: Left Arm)   Pulse 86   Temp 98 F (36.7 C) (Oral)   Resp 20   SpO2 99%  Physical Exam Vitals and nursing note reviewed.  Constitutional:      General: He is not in acute distress.    Appearance: He is well-developed.  HENT:     Head: Normocephalic and atraumatic.  Eyes:     Extraocular Movements: Extraocular movements intact.     Conjunctiva/sclera: Conjunctivae normal.  Cardiovascular:     Rate and Rhythm: Normal rate and regular rhythm.     Heart sounds: Normal heart sounds. No murmur heard. Pulmonary:     Effort: Pulmonary effort is normal. No respiratory distress.     Breath sounds: Normal breath sounds.  Abdominal:     General: Abdomen is flat. Bowel sounds are normal.     Palpations: Abdomen is soft.     Tenderness: There is abdominal tenderness in the right upper quadrant. There is no guarding or rebound. Positive signs include Murphy's sign. Negative signs include Rovsing's sign and McBurney's sign.  Musculoskeletal:        General: No swelling.     Cervical back: Neck supple.  Skin:    General: Skin is warm and dry.     Capillary Refill: Capillary refill takes less than 2 seconds.  Coloration: Skin is not jaundiced.  Neurological:     General: No focal deficit present.     Mental Status: He is alert.  Psychiatric:        Mood and Affect: Mood normal.     ED Results / Procedures / Treatments   Labs (all labs ordered are listed, but only abnormal results are displayed) Labs Reviewed  COMPREHENSIVE METABOLIC PANEL - Abnormal; Notable for the following components:      Result Value   Glucose, Bld 107 (*)    All other components within normal limits  CBC WITH DIFFERENTIAL/PLATELET - Abnormal; Notable for the following components:   WBC 12.7 (*)    Neutro Abs 8.9 (*)    All other components within normal limits  URINALYSIS, ROUTINE W REFLEX MICROSCOPIC - Abnormal; Notable for the  following components:   Specific Gravity, Urine >1.046 (*)    Ketones, ur 20 (*)    Protein, ur 30 (*)    All other components within normal limits  LIPASE, BLOOD    EKG None  Radiology US  Abdomen Limited RUQ (LIVER/GB) Result Date: 04/21/2023 CLINICAL DATA:  Right upper quadrant pain EXAM: ULTRASOUND ABDOMEN LIMITED RIGHT UPPER QUADRANT COMPARISON:  CT 04/21/2023 and older. FINDINGS: Gallbladder: Dilated gallbladder. There is wall thickening and wall edema. No shadowing stones. Common bile duct: Diameter: 4 mm Liver: Mildly echogenic hepatic parenchyma. Please correlate for fatty liver infiltration. Portal vein is patent on color Doppler imaging with normal direction of blood flow towards the liver. Other: None. IMPRESSION: Dilated gallbladder with wall thickening and wall edema. There are no shadowing stones. Please correlate for any clinical evidence of acalculous cholecystitis or other process. If needed further workup with a HIDA scan could be considered. No biliary ductal dilatation. Electronically Signed   By: Ranell Bring M.D.   On: 04/21/2023 16:59   CT ABDOMEN PELVIS W CONTRAST Result Date: 04/21/2023 CLINICAL DATA:  Acute generalized abdominal pain. EXAM: CT ABDOMEN AND PELVIS WITH CONTRAST TECHNIQUE: Multidetector CT imaging of the abdomen and pelvis was performed using the standard protocol following bolus administration of intravenous contrast. RADIATION DOSE REDUCTION: This exam was performed according to the departmental dose-optimization program which includes automated exposure control, adjustment of the mA and/or kV according to patient size and/or use of iterative reconstruction technique. CONTRAST:  OMNIPAQUE  IOHEXOL  300 MG/ML  SOLN COMPARISON:  May 11, 2018. FINDINGS: Lower chest: No acute abnormality. Hepatobiliary: Minimal to mild gallbladder wall thickening is noted without cholelithiasis. No biliary dilatation is noted. The liver is unremarkable. Pancreas:  Unremarkable. No pancreatic ductal dilatation or surrounding inflammatory changes. Spleen: Normal in size without focal abnormality. Adrenals/Urinary Tract: Adrenal glands are unremarkable. Bilateral renal cysts are noted for which no further follow-up is required. No hydronephrosis or renal obstruction is noted. Urinary bladder is unremarkable. Stomach/Bowel: Stomach is within normal limits. Appendix appears normal. No evidence of bowel wall thickening, distention, or inflammatory changes. Vascular/Lymphatic: No significant vascular findings are present. No enlarged abdominal or pelvic lymph nodes. Reproductive: Prostate is unremarkable. Other: No abdominal wall hernia or abnormality. No abdominopelvic ascites. Musculoskeletal: No acute or significant osseous findings. IMPRESSION: Minimal to mild gallbladder wall thickening is noted without cholelithiasis. Ultrasound may be performed for further evaluation. No other abnormality seen in the abdomen or pelvis. Electronically Signed   By: Lynwood Landy Raddle M.D.   On: 04/21/2023 13:58    Procedures Procedures    Medications Ordered in ED Medications  ondansetron  (ZOFRAN -ODT) disintegrating tablet 4 mg (4  mg Oral Given 04/21/23 0834)  iohexol  (OMNIPAQUE ) 300 MG/ML solution 100 mL (100 mLs Intravenous Contrast Given 04/21/23 1322)  ondansetron  (ZOFRAN -ODT) disintegrating tablet 4 mg (4 mg Oral Given 04/21/23 2258)  ketorolac  (TORADOL ) 15 MG/ML injection 15 mg (15 mg Intravenous Given 04/21/23 2258)    ED Course/ Medical Decision Making/ A&P                                 Medical Decision Making Risk Prescription drug management.   This patient presents to the ED with chief complaint(s) of vomiting, abdominal pain, and back pain with pertinent past medical history of back spasms which further complicates the presenting complaint. The complaint involves an extensive differential diagnosis and also carries with it a high risk of complications and morbidity.     The differential diagnosis includes cholecystitis, choledocholithiasis, cholelithiasis, appendicitis  Additional history obtained: Records reviewed Primary Care Documents  ED Course and Reassessment:   Independent labs interpretation:  The following labs were independently interpreted:  CBC: Leukocytosis at 12.7 CMP: No notable findings Lipase: 29  Independent visualization of imaging: - I independently visualized the following imaging with scope of interpretation limited to determining acute life threatening conditions related to emergency care:  CT abdomen pelvis with contrast: Minimal to mild gallbladder wall thickening without cholelithiasis Ultrasound abdomen limited right upper quadrant: Dilated gallbladder with wall thickening and wall edema: There are no shadowing stones.  No biliary ductal dilation  Consultation: - Consulted or discussed management/test interpretation w/ external professional: None  Consideration for admission or further workup: Considered for mission further workup however patient's vital signs, physical exam, labs, and imaging have been reassuring.  Patient was able to tolerate oral intake prior to discharge.  Patient discharged with outpatient Zofran  and general surgery follow-up for evaluation for possible cholecystectomy.        Final Clinical Impression(s) / ED Diagnoses Final diagnoses:  Right upper quadrant abdominal pain    Rx / DC Orders ED Discharge Orders          Ordered    ondansetron  (ZOFRAN ) 4 MG tablet  Every 8 hours PRN        04/21/23 2056              Francis Ileana SAILOR, PA-C 04/21/23 2337    Patt Alm Macho, MD 04/23/23 [phone]

## 2023-04-21 NOTE — ED Notes (Signed)
 Unsuccessful IV attempt in CT, charge RN notified. Pt sent back to [facility]

## 2023-04-21 NOTE — ED Provider Triage Note (Signed)
 Emergency Medicine Provider Triage Evaluation Note  Kevin Schultz , a 23 y.o. male  was evaluated in triage.  Pt complains of abdominal pain, vomiting, back pain.  Review of Systems  Positive:  Negative:   Physical Exam  There were no vitals taken for this visit. Gen:   Awake, no distress   Resp:  Normal effort  MSK:   Moves extremities without difficulty  Other:    Medical Decision Making  Medically screening exam initiated at 8:21 AM.  Appropriate orders placed.  Kevin Schultz was informed that the remainder of the evaluation will be completed by another provider, this initial triage assessment does not replace that evaluation, and the importance of remaining in the ED until their evaluation is complete.  Abdominal pain, vomiting, and back pain started at 3AM this morning. Patient with history of severe back spasms which makes him nauseous. Upper abdominal pain. Denies fever, hematemesis, diarrhea, dysuria, hematuria, hematochezia.   Hoy Fraction F, [place] 04/21/23 [phone]

## 2023-04-21 NOTE — Discharge Instructions (Addendum)
 Today you are seen for right upper quadrant pain.  Please pick up your Zofran  and take as needed for nausea and vomiting.  Please follow-up with general surgery for further evaluation and treatment if symptoms persist.  Thank you for letting us  treat you today. After reviewing your labs and imaging, I feel you are safe to go home. Please follow up with your PCP in the next several days and provide them with your records from this visit. Return to the Emergency Room if pain becomes severe or symptoms worsen.

## 2023-05-07 NOTE — Progress Notes (Signed)
[facility] Initial Consultation   Referring Provider [name], NP [address] [place],  [place] [address]  Primary Care Provider [name], NP  Patient Profile: Kevin Schultz is a 23 y.o. male who is seen in consultation in the [facility] at the request of Dr. Randa Evens for evaluation and management of the problem(s) noted below.  Problem List: Right upper quadrant abdominal pain Possible fatty liver on US imaging  History of Present Illness   Kevin Schultz is a 23 y.o. male without any significant past medical history who presents for evaluation and management of abdominal pain and possible fatty liver on recent ultrasound imaging.  Duvan reports a 64-month history of intermittent right upper quadrant abdominal pain radiating to his back Pain is described as sharp occurring 1-2 times per week Not necessarily instigated by eating -he cannot identify any specific triggers Notes that the pain oftentimes occurs more late at night than during the day Endorses associated nausea but no vomiting States that he has had more constipation than usual with a decrease in bowel frequency Denies diarrhea Saw scant red blood on the tissue paper after wiping once when he was constipated but no chronic hematochezia Notes that pain is alleviated by ibuprofen but he is not taking NSAIDs on a regular basis Over the course of his symptoms he has lost approximately 4 pounds 200 lbs --> 196 lbs  Kevin Schultz was seen in the [facility] ED 04/21/2023 for evaluation of his right upper quadrant abdominal pain Labs: WBC 12.7, Hgb 14.6, HCT 43.9, platelet 388, lipase 29, normal CMP CTAP: Minimal to mild gallbladder wall thickening without cholelithiasis; normal liver RUQ [place]: Dilated gallbladder with wall thickening and wall edema without stones; mild echogenic hepatic parenchyma -correlate for fatty liver infiltration ED notes referrals placed to GI and  surgery for fatty liver and consideration of cholecystectomy   Notation is made of 2 previous ED visits over the last 6 months: 12/14/2022-back spasms 02/07/2023-hematemesis after consumption of alcohol - nl labs and given Protonix   Denies any previous history of liver disease Reports that over the last few years he has gained approximately 40 pounds No family history of liver disease, gallbladder disease, peptic ulcer disease or gastrointestinal malignancies  Last colonoscopy: None Last endoscopy: None  Last Abd CT/CTE/MRE: 04/21/2023 -minimal to mild gallbladder wall thickening is noted without cholelithiasis; normal liver  GI Review of Symptoms Significant for None. Otherwise negative.  General Review of Systems  Review of systems is significant for the pertinent positives and negatives as listed per the HPI.  Full ROS is otherwise negative.  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   escitalopram (LEXAPRO) 10 MG tablet Take 1 tablet (10 mg total) by mouth daily.   ibuprofen (ADVIL) 800 MG tablet Take 800 mg by mouth 3 (three) times daily as needed.   ondansetron (ZOFRAN) 4 MG tablet Take 1 tablet (4 mg total) by mouth every 8 (eight) hours as needed for nausea or vomiting.  Family History  No family history of liver disease, gallbladder disease, peptic ulcer disease or gastrointestinal malignancies   Social History   Social History   Tobacco Use   Smoking status: Former    Types: Cigarettes, Cigars   Smokeless tobacco: Never   Tobacco comments:    Black and mild's in  high school  Vaping Use   Vaping status: Never Used  Substance Use Topics   Alcohol use: Not Currently   Drug use: Not Currently    Types: Marijuana   Kevin Schultz reports that he has quit smoking. His smoking use included cigarettes and cigars. He has never used smokeless tobacco. He reports that he does not currently use alcohol. He reports that he does not currently use drugs after having used the following drugs: Marijuana.  Currently unemployed  Vital Signs and Physical Examination   Vitals:   05/08/23 1513  BP: 106/68  Pulse: (!) 128  Height: 5' 5.5" (1.664 m) Comment: height measured without shoes  Weight: 196 lb 8 oz (89.1 kg)  BMI (Calculated): 32.19    General: Well developed, well nourished, no acute distress Head: Normocephalic and atraumatic Eyes: Sclerae anicteric, EOMI Ears: Normal auditory acuity Mouth: No deformities or lesions noted Lungs: Clear throughout to auscultation Heart: Regular rate and rhythm; No murmurs, rubs or bruits Abdomen: Soft, non tender and non distended. No masses, hepatosplenomegaly or hernias noted. Normal Bowel sounds Rectal: Deferred Musculoskeletal: Symmetrical with no gross deformities  Pulses:  Normal pulses noted Extremities: No edema or deformities noted Neurological: Alert oriented x 4, grossly nonfocal Psychological:  Alert and cooperative. Normal mood and affect  Review of Data  The following data was reviewed at the time of this encounter:  Laboratory Studies      Latest Ref Rng & Units 04/21/2023    8:30 AM 02/07/2023   10:32 PM 05/14/2019    4:45 AM  CBC  WBC 4.0 - 10.5 K/uL 12.7  14.0  17.9   Hemoglobin 13.0 - 17.0 g/dL 16.1  09.6  04.5   Hematocrit 39.0 - 52.0 % 43.9  44.2  45.9   Platelets 150 - 400 K/uL 388  406  430     Lab Results  Component Value Date   LIPASE 29 04/21/2023      Latest Ref Rng & Units 04/21/2023    8:30 AM 04/07/2023    8:03 AM 02/07/2023   10:32 PM  CMP  Glucose 70 - 99 mg/dL 409  811   914   BUN 6 - 20 mg/dL 19  15  9    Creatinine 0.61 - 1.24 mg/dL 7.82  9.56  2.13   Sodium 135 - 145 mmol/L 135  137  136   Potassium 3.5 - 5.1 mmol/L 3.9  4.8  4.1   Chloride 98 - 111 mmol/L 101  103  100   CO2 22 - 32 mmol/L 24  25  26    Calcium 8.9 - 10.3 mg/dL 9.6  9.3  9.0   Total Protein 6.5 - 8.1 g/dL 7.8   7.8   Total Bilirubin 0.0 - 1.2 mg/dL 0.4   0.4   Alkaline Phos 38 - 126 U/L 78   85   AST 15 - 41 U/L 24   21   ALT 0 - 44 U/L 19   14     Imaging Studies  RUQ [place] 04/21/2023 Dilated gallbladder with wall thickening and wall edema. There are no shadowing stones. Please correlate for any clinical evidence  of acalculous cholecystitis or other process. If needed further workup with a HIDA scan could be considered. No biliary ductal dilatation.  CTAP 04/21/2023 Minimal to mild gallbladder wall thickening is noted without cholelithiasis. Ultrasound may be performed for further evaluation.  CTAP 05/11/2018 Normal    GI Procedures and Studies  None    Clinical Impression  It is my clinical impression that Kevin Schultz is a 23 y.o. male with;  Right upper quadrant abdominal pain Possible fatty liver on US imaging  Kevin Schultz presents to the office today for evaluation and management of right upper quadrant abdominal pain and possible fatty liver on ultrasound imaging.  He reports a 35-month history of episodic right upper quadrant abdominal pain radiating to his back occurring 1-2 times per week.  Pain is not necessarily instigated by the ingestion of food.  Endorses associated nausea but no vomiting.  He was recently seen in the ED 04/21/2023 where laboratory studies were reassuring.  CTAP and right upper quadrant ultrasound showed gallbladder wall thickening and edema without cholelithiasis or choledocholithiasis.  There was also notation of liver echogenicity suggestive of fatty liver.  At today's visit, we discussed that his episodic abdominal pain could be related  to biliary pain/biliary dyskinesia.  Given the episodic nature of his symptoms gastritis, peptic ulcer disease and H. pylori infection seem less likely etiologies.  We discussed proceeding with [facility] HIDA for further evaluation of his abdominal pain.  Also reviewed that the ultrasound findings of echogenicity are mild.  This could be related to either the weight gain he reported in recent years or alcohol consumption.  I do not think it is related to his report of abdominal pain.  In the setting of otherwise normal liver enzymes this can be monitored conservatively over time.   Plan  Schedule [facility] HIDA Pending results of [facility] HIDA follow-up with surgery regarding biliary pain If biliary etiology is excluded can consider upper endoscopy for evaluation of gastritis, PUD, H. pylori infection Cautious use of ibuprofen for abdominal pain which could potentially exacerbate an underlying GI pathology Continue Zofran as previously prescribed Conservative monitoring of mild fatty infiltration -LFTs can be checked periodically in the future  Planned Follow Up 4 months  The patient or caregiver verbalized understanding of the material covered, with no barriers to understanding. All questions were answered. Patient or caregiver is agreeable with the plan outlined above.    It was a pleasure to see Kevin Schultz.  If you have any questions or concerns regarding this evaluation, do not hesitate to contact me.  [name], MD [facility]  I spent total of 30 minutes in both face-to-face and non-face-to-face activities, excluding procedures performed, for the visit on the date of this encounter.

## 2023-05-08 ENCOUNTER — Encounter: Payer: Self-pay | Admitting: Pediatrics

## 2023-05-08 ENCOUNTER — Ambulatory Visit (INDEPENDENT_AMBULATORY_CARE_PROVIDER_SITE_OTHER): Payer: Medicaid Other | Admitting: Pediatrics

## 2023-05-08 VITALS — BP 106/68 | HR 128 | Ht 65.5 in | Wt 196.5 lb

## 2023-05-08 DIAGNOSIS — R932 Abnormal findings on diagnostic imaging of liver and biliary tract: Secondary | ICD-10-CM

## 2023-05-08 DIAGNOSIS — R1011 Right upper quadrant pain: Secondary | ICD-10-CM | POA: Diagnosis not present

## 2023-05-08 DIAGNOSIS — R109 Unspecified abdominal pain: Secondary | ICD-10-CM

## 2023-05-08 DIAGNOSIS — K760 Fatty (change of) liver, not elsewhere classified: Secondary | ICD-10-CM

## 2023-05-08 NOTE — Patient Instructions (Addendum)
You have been scheduled for a HIDA scan at [facility] Radiology (1st floor) on 05/13/23. Please arrive 30 minutes prior to your scheduled appointment at  8 am. Make certain not to have anything to eat or drink after midnight prior to your test. Should this appointment date or time not work well for you, please call radiology scheduling at [phone].  _____________________________________________________________________ hepatobiliary (HIDA) scan is an imaging procedure used to diagnose problems in the liver, gallbladder and bile ducts. In the HIDA scan, a radioactive chemical or tracer is injected into a vein in your arm. The tracer is handled by the liver like bile. Bile is a fluid produced and excreted by your liver that helps your digestive system break down fats in the foods you eat. Bile is stored in your gallbladder and the gallbladder releases the bile when you eat a meal. A special nuclear medicine scanner (gamma camera) tracks the flow of the tracer from your liver into your gallbladder and small intestine.  During your HIDA scan  You'll be asked to change into a hospital gown before your HIDA scan begins. Your health care team will position you on a table, usually on your back. The radioactive tracer is then injected into a vein in your arm.The tracer travels through your bloodstream to your liver, where it's taken up by the bile-producing cells. The radioactive tracer travels with the bile from your liver into your gallbladder and through your bile ducts to your small intestine.You may feel some pressure while the radioactive tracer is injected into your vein. As you lie on the table, a special gamma camera is positioned over your abdomen taking pictures of the tracer as it moves through your body. The gamma camera takes pictures continually for about an hour. You'll need to keep still during the HIDA scan. This can become uncomfortable, but you may find that you can lessen the discomfort by taking  deep breaths and thinking about other things. Tell your health care team if you're uncomfortable. The radiologist will watch on a computer the progress of the radioactive tracer through your body. The HIDA scan may be stopped when the radioactive tracer is seen in the gallbladder and enters your small intestine. This typically takes about an hour. In some cases extra imaging will be performed if original images aren't satisfactory, if morphine is given to help visualize the gallbladder or if the medication CCK is given to look at the contraction of the gallbladder. This test typically takes 2 hours to complete.  Follow up in 4 months If your blood pressure at your visit was 140/90 or greater, please contact your primary care physician to follow up on this.  _______________________________________________________  If you are age 53 or older, your body mass index should be between 23-30. Your Body mass index is 32.2 kg/m. If this is out of the aforementioned range listed, please consider follow up with your Primary Care Provider.  If you are age 4 or younger, your body mass index should be between 19-25. Your Body mass index is 32.2 kg/m. If this is out of the aformentioned range listed, please consider follow up with your Primary Care Provider.   ________________________________________________________  The [place] [facility] providers would like to encourage you to use [facility] to communicate with providers for non-urgent requests or questions.  Due to long hold times on the telephone, sending your provider a message by [facility] may be a faster and more efficient way to get a response.  Please allow 48  business hours for a response.  Please remember that this is for non-urgent requests.  _______________________________________________________  Due to recent changes in healthcare laws, you may see the results of your imaging and laboratory studies on MyChart before your provider has had a chance to review  them.  We understand that in some cases there may be results that are confusing or concerning to you. Not all laboratory results come back in the same time frame and the provider may be waiting for multiple results in order to interpret others.  Please give [place] 48 hours in order for your provider to thoroughly review all the results before contacting the office for clarification of your results.   Thank you for entrusting me with your care and choosing [facility].  Dr Doy Hutching

## 2023-05-13 ENCOUNTER — Encounter (HOSPITAL_COMMUNITY)
Admission: RE | Admit: 2023-05-13 | Discharge: 2023-05-13 | Disposition: A | Discharge status: Home / Self Care | Payer: Medicaid Other | Source: Ambulatory Visit | Attending: Pediatrics | Admitting: Pediatrics

## 2023-05-13 ENCOUNTER — Other Ambulatory Visit: Payer: Self-pay | Admitting: Pediatrics

## 2023-05-13 DIAGNOSIS — R109 Unspecified abdominal pain: Secondary | ICD-10-CM

## 2023-05-13 DIAGNOSIS — R1011 Right upper quadrant pain: Secondary | ICD-10-CM

## 2023-05-13 DIAGNOSIS — K760 Fatty (change of) liver, not elsewhere classified: Secondary | ICD-10-CM

## 2023-05-13 MED ORDER — MORPHINE SULFATE (PF) 2 MG/ML IV SOLN
INTRAVENOUS | Status: AC
  Filled 2023-05-13: qty 2

## 2023-05-13 MED ORDER — MORPHINE SULFATE (PF) 2 MG/ML IV SOLN
3.0000 mg | Freq: Once | INTRAVENOUS | Status: AC
  Administered 2023-05-13: 3 mg via INTRAVENOUS

## 2023-05-13 MED ORDER — TECHNETIUM TC 99M MEBROFENIN IV KIT
5.4000 | PACK | Freq: Once | INTRAVENOUS | Status: AC | PRN
  Administered 2023-05-13: 5.4 via INTRAVENOUS

## 2023-05-16 ENCOUNTER — Ambulatory Visit: Payer: Self-pay

## 2023-06-09 ENCOUNTER — Ambulatory Visit: Payer: Self-pay | Admitting: Surgery

## 2023-06-15 NOTE — Progress Notes (Signed)
 COVID Vaccine Completed:  Date of COVID positive in last 90 days:  PCP - Gwinda Passe, NP Cardiologist -   Chest x-ray -  EKG - 02-09-23 Epic Stress Test -  ECHO -  Cardiac Cath -  Pacemaker/ICD device last checked: Spinal Cord Stimulator:  Bowel Prep -   Sleep Study -  CPAP -   Fasting Blood Sugar -  Checks Blood Sugar _____ times a day  Last dose of GLP1 agonist-  N/A GLP1 instructions:  Hold 7 days before surgery    Last dose of SGLT-2 inhibitors-  N/A SGLT-2 instructions:  Hold 3 days before surgery    Blood Thinner Instructions:  Last dose:   Time: Aspirin Instructions: Last Dose:  Activity level:  Can go up a flight of stairs and perform activities of daily living without stopping and without symptoms of chest pain or shortness of breath.  Able to exercise without symptoms  Unable to go up a flight of stairs without symptoms of     Anesthesia review:   Patient denies shortness of breath, fever, cough and chest pain at PAT appointment  Patient verbalized understanding of instructions that were given to them at the PAT appointment. Patient was also instructed that they will need to review over the PAT instructions again at home before surgery.

## 2023-06-15 NOTE — Patient Instructions (Addendum)
 SURGICAL WAITING ROOM VISITATION Patients having surgery or a procedure may have no more than 2 support people in the waiting area - these visitors may rotate.    Children under the age of 64 must have an adult with them who is not the patient.  Due to an increase in RSV and influenza rates and associated hospitalizations, children ages 73 and under may not visit patients in [facility] hospitals.   If the patient needs to stay at the hospital during part of their recovery, the visitor guidelines for inpatient rooms apply. Pre-op nurse will coordinate an appropriate time for 1 support person to accompany patient in pre-op.  This support person may not rotate.    Please refer to the [facility] website for the visitor guidelines for Inpatients (after your surgery is over and you are in a regular room).       Your procedure is scheduled on: 06-23-23   Report to [facility] Main Entrance    Report to admitting at 1:!5 PM   Call this number if you have problems the morning of surgery [phone]   Do not eat food :After Midnight.   After Midnight you may have the following liquids until  12:30 PM DAY OF SURGERY  Water Non-Citrus Juices (without pulp, NO RED-Apple, White grape, White cranberry) Black Coffee (NO MILK/CREAM OR CREAMERS, sugar ok)  Clear Tea (NO MILK/CREAM OR CREAMERS, sugar ok) regular and decaf                             Plain Jell-O (NO RED)                                           Fruit ices (not with fruit pulp, NO RED)                                     Popsicles (NO RED)                                                               Sports drinks like Gatorade (NO RED)                       If you have questions, please contact your surgeon's office.   FOLLOW  ANY ADDITIONAL PRE OP INSTRUCTIONS YOU RECEIVED FROM YOUR SURGEON'S OFFICE!!!     Oral Hygiene is also important to reduce your risk of infection.                                    Remember  - BRUSH YOUR TEETH THE MORNING OF SURGERY WITH YOUR REGULAR TOOTHPASTE   Do NOT smoke after Midnight   Take these medicines the morning of surgery with A SIP OF WATER:    Diazepam if needed  Stop all vitamins and herbal supplements 7 days before surgery  You may not have any metal on your body including  jewelry, and body piercing             Do not wear lotions, powders, cologne, or deodorant              Men may shave face and neck.   Do not bring valuables to the hospital. [facility] IS NOT RESPONSIBLE   FOR VALUABLES.   Contacts, dentures or bridgework may not be worn into surgery.  DO NOT BRING YOUR HOME MEDICATIONS TO THE HOSPITAL. PHARMACY WILL DISPENSE MEDICATIONS LISTED ON YOUR MEDICATION LIST TO YOU DURING YOUR ADMISSION IN THE HOSPITAL!    Patients discharged on the day of surgery will not be allowed to drive home.  Someone NEEDS to stay with you for the first 24 hours after anesthesia.              Please read over the following fact sheets you were given: IF YOU HAVE QUESTIONS ABOUT YOUR PRE-OP INSTRUCTIONS PLEASE CALL [phone] [facility]  If you received a COVID test during your pre-op visit  it is requested that you wear a mask when out in public, stay away from anyone that may not be feeling well and notify your surgeon if you develop symptoms. If you test positive for Covid or have been in contact with anyone that has tested positive in the last 10 days please notify you surgeon.   - Preparing for Surgery Before surgery, you can play an important role.  Because skin is not sterile, your skin needs to be as free of germs as possible.  You can reduce the number of germs on your skin by washing with CHG (chlorahexidine gluconate) soap before surgery.  CHG is an antiseptic cleaner which kills germs and bonds with the skin to continue killing germs even after washing. Please DO NOT use if you have an allergy to CHG or antibacterial  soaps.  If your skin becomes reddened/irritated stop using the CHG and inform your nurse when you arrive at Short Stay. Do not shave (including legs and underarms) for at least 48 hours prior to the first CHG shower.  You may shave your face/neck.  Please follow these instructions carefully:  1.  Shower with CHG Soap the night before surgery and the  morning of surgery.  2.  If you choose to wash your hair, wash your hair first as usual with your normal  shampoo.  3.  After you shampoo, rinse your hair and body thoroughly to remove the shampoo.                             4.  Use CHG as you would any other liquid soap.  You can apply chg directly to the skin and wash.  Gently with a scrungie or clean washcloth.  5.  Apply the CHG Soap to your body ONLY FROM THE NECK DOWN.   Do   not use on face/ open                           Wound or open sores. Avoid contact with eyes, ears mouth and   genitals (private parts).                       Wash face,  Genitals (private parts) with your normal soap.  6.  Wash thoroughly, paying special attention to the area where your    surgery  will be performed.  7.  Thoroughly rinse your body with warm water from the neck down.  8.  DO NOT shower/wash with your normal soap after using and rinsing off the CHG Soap.                9.  Pat yourself dry with a clean towel.            10.  Wear clean pajamas.            11.  Place clean sheets on your bed the night of your first shower and do not  sleep with pets. Day of Surgery : Do not apply any lotions/deodorants the morning of surgery.  Please wear clean clothes to the hospital/surgery center.  FAILURE TO FOLLOW THESE INSTRUCTIONS MAY RESULT IN THE CANCELLATION OF YOUR SURGERY  PATIENT SIGNATURE_________________________________  NURSE SIGNATURE__________________________________  ________________________________________________________________________

## 2023-06-18 ENCOUNTER — Other Ambulatory Visit: Payer: Self-pay

## 2023-06-18 ENCOUNTER — Encounter (HOSPITAL_COMMUNITY)
Admission: RE | Admit: 2023-06-18 | Discharge: 2023-06-18 | Disposition: A | Discharge status: Home / Self Care | Payer: Medicaid Other | Source: Ambulatory Visit | Attending: Surgery | Admitting: Surgery

## 2023-06-18 ENCOUNTER — Encounter (HOSPITAL_COMMUNITY): Payer: Self-pay

## 2023-06-18 VITALS — Ht 65.0 in | Wt 190.0 lb

## 2023-06-18 DIAGNOSIS — Z01818 Encounter for other preprocedural examination: Secondary | ICD-10-CM

## 2023-06-18 HISTORY — DX: Gastro-esophageal reflux disease without esophagitis: K21.9

## 2023-06-23 ENCOUNTER — Other Ambulatory Visit: Payer: Self-pay

## 2023-06-23 ENCOUNTER — Encounter (HOSPITAL_COMMUNITY)
Admission: RE | Disposition: A | Discharge status: Home / Self Care | Payer: Self-pay | Source: Ambulatory Visit | Attending: Surgery

## 2023-06-23 ENCOUNTER — Ambulatory Visit (HOSPITAL_BASED_OUTPATIENT_CLINIC_OR_DEPARTMENT_OTHER): Admitting: Anesthesiology

## 2023-06-23 ENCOUNTER — Ambulatory Visit (HOSPITAL_COMMUNITY): Admitting: Anesthesiology

## 2023-06-23 ENCOUNTER — Encounter (HOSPITAL_COMMUNITY): Payer: Self-pay | Admitting: Surgery

## 2023-06-23 ENCOUNTER — Ambulatory Visit (HOSPITAL_COMMUNITY)
Admission: RE | Admit: 2023-06-23 | Discharge: 2023-06-23 | Disposition: A | Discharge status: Home / Self Care | Payer: Medicaid Other | Source: Ambulatory Visit | Attending: Surgery | Admitting: Surgery

## 2023-06-23 DIAGNOSIS — F419 Anxiety disorder, unspecified: Secondary | ICD-10-CM | POA: Diagnosis not present

## 2023-06-23 DIAGNOSIS — R569 Unspecified convulsions: Secondary | ICD-10-CM | POA: Insufficient documentation

## 2023-06-23 DIAGNOSIS — E669 Obesity, unspecified: Secondary | ICD-10-CM | POA: Diagnosis not present

## 2023-06-23 DIAGNOSIS — Z87891 Personal history of nicotine dependence: Secondary | ICD-10-CM | POA: Insufficient documentation

## 2023-06-23 DIAGNOSIS — K8012 Calculus of gallbladder with acute and chronic cholecystitis without obstruction: Secondary | ICD-10-CM | POA: Insufficient documentation

## 2023-06-23 DIAGNOSIS — K810 Acute cholecystitis: Secondary | ICD-10-CM | POA: Diagnosis not present

## 2023-06-23 DIAGNOSIS — Z6831 Body mass index (BMI) 31.0-31.9, adult: Secondary | ICD-10-CM | POA: Diagnosis not present

## 2023-06-23 DIAGNOSIS — Z01818 Encounter for other preprocedural examination: Secondary | ICD-10-CM

## 2023-06-23 DIAGNOSIS — K219 Gastro-esophageal reflux disease without esophagitis: Secondary | ICD-10-CM | POA: Insufficient documentation

## 2023-06-23 SURGERY — LAPAROSCOPIC CHOLECYSTECTOMY
Anesthesia: General

## 2023-06-23 MED ORDER — CHLORHEXIDINE GLUCONATE CLOTH 2 % EX PADS
6.0000 | MEDICATED_PAD | Freq: Once | CUTANEOUS | Status: DC
Start: 2023-06-23 — End: 2023-06-23

## 2023-06-23 MED ORDER — KETOROLAC TROMETHAMINE 15 MG/ML IJ SOLN
15.0000 mg | INTRAMUSCULAR | Status: DC
Start: 2023-06-24 — End: 2023-06-23

## 2023-06-23 MED ORDER — AMISULPRIDE (ANTIEMETIC) 5 MG/2ML IV SOLN: 10.0000 mg | Freq: Once | INTRAVENOUS | Status: DC | PRN

## 2023-06-23 MED ORDER — FENTANYL CITRATE PF 50 MCG/ML IJ SOSY
PREFILLED_SYRINGE | INTRAMUSCULAR | Status: AC
  Administered 2023-06-23: 50 ug via INTRAVENOUS
  Filled 2023-06-23: qty 2

## 2023-06-23 MED ORDER — ONDANSETRON HCL 4 MG/2ML IJ SOLN
INTRAMUSCULAR | Status: DC | PRN
  Administered 2023-06-23: 4 mg via INTRAVENOUS

## 2023-06-23 MED ORDER — GABAPENTIN 300 MG PO CAPS
300.0000 mg | ORAL_CAPSULE | ORAL | Status: AC
Start: 2023-06-24 — End: 2023-06-23
  Administered 2023-06-23: 300 mg via ORAL
  Filled 2023-06-23: qty 1

## 2023-06-23 MED ORDER — BUPIVACAINE-EPINEPHRINE (PF) 0.25% -1:200000 IJ SOLN
INTRAMUSCULAR | Status: AC
  Filled 2023-06-23: qty 30

## 2023-06-23 MED ORDER — FENTANYL CITRATE PF 50 MCG/ML IJ SOSY
25.0000 ug | PREFILLED_SYRINGE | INTRAMUSCULAR | Status: DC | PRN
  Administered 2023-06-23 (×2): 50 ug via INTRAVENOUS

## 2023-06-23 MED ORDER — OXYCODONE HCL 5 MG PO TABS: 5.0000 mg | ORAL_TABLET | Freq: Once | ORAL | Status: AC

## 2023-06-23 MED ORDER — 0.9 % SODIUM CHLORIDE (POUR BTL) OPTIME
TOPICAL | Status: DC | PRN
Start: 2023-06-23 — End: 2023-06-23
  Administered 2023-06-23: 1000 mL

## 2023-06-23 MED ORDER — OXYCODONE HCL 5 MG PO TABS
ORAL_TABLET | ORAL | Status: AC
  Filled 2023-06-23: qty 1

## 2023-06-23 MED ORDER — LIDOCAINE HCL (PF) 2 % IJ SOLN
INTRAMUSCULAR | Status: DC | PRN
  Administered 2023-06-23: 60 mg via INTRADERMAL

## 2023-06-23 MED ORDER — DEXMEDETOMIDINE HCL IN NACL 80 MCG/20ML IV SOLN
INTRAVENOUS | Status: AC
  Filled 2023-06-23: qty 20

## 2023-06-23 MED ORDER — OXYCODONE HCL 5 MG/5ML PO SOLN: 5.0000 mg | Freq: Once | ORAL | Status: DC

## 2023-06-23 MED ORDER — ROCURONIUM BROMIDE 10 MG/ML (PF) SYRINGE
PREFILLED_SYRINGE | INTRAVENOUS | Status: AC
  Filled 2023-06-23: qty 10

## 2023-06-23 MED ORDER — CEFAZOLIN SODIUM-DEXTROSE 2-4 GM/100ML-% IV SOLN
2.0000 g | INTRAVENOUS | Status: AC
  Administered 2023-06-23: 2 g via INTRAVENOUS
  Filled 2023-06-23: qty 100

## 2023-06-23 MED ORDER — ORAL CARE MOUTH RINSE: 15.0000 mL | Freq: Once | OROMUCOSAL | Status: AC

## 2023-06-23 MED ORDER — MIDAZOLAM HCL 2 MG/2ML IJ SOLN
INTRAMUSCULAR | Status: DC | PRN
Start: 2023-06-23 — End: 2023-06-23
  Administered 2023-06-23: 2 mg via INTRAVENOUS

## 2023-06-23 MED ORDER — LACTATED RINGERS IR SOLN
Status: DC | PRN
  Administered 2023-06-23: 1000 mL

## 2023-06-23 MED ORDER — OXYCODONE HCL 5 MG PO TABS
ORAL_TABLET | ORAL | Status: AC
Start: 2023-06-23 — End: 2023-06-23
  Administered 2023-06-23: 5 mg via ORAL
  Filled 2023-06-23: qty 1

## 2023-06-23 MED ORDER — OXYCODONE-ACETAMINOPHEN 5-325 MG PO TABS: 1.0000 | ORAL_TABLET | ORAL | 0 refills | Status: DC | PRN

## 2023-06-23 MED ORDER — ROCURONIUM BROMIDE 10 MG/ML (PF) SYRINGE
PREFILLED_SYRINGE | INTRAVENOUS | Status: DC | PRN
  Administered 2023-06-23: 50 mg via INTRAVENOUS

## 2023-06-23 MED ORDER — ACETAMINOPHEN 500 MG PO TABS
1000.0000 mg | ORAL_TABLET | ORAL | Status: DC
Start: 2023-06-24 — End: 2023-06-23

## 2023-06-23 MED ORDER — DEXMEDETOMIDINE HCL IN NACL 80 MCG/20ML IV SOLN
INTRAVENOUS | Status: DC | PRN
  Administered 2023-06-23: 4 ug via INTRAVENOUS
  Administered 2023-06-23: 8 ug via INTRAVENOUS

## 2023-06-23 MED ORDER — ONDANSETRON HCL 4 MG/2ML IJ SOLN: 4.0000 mg | Freq: Once | INTRAMUSCULAR | Status: DC | PRN

## 2023-06-23 MED ORDER — BUPIVACAINE-EPINEPHRINE 0.25% -1:200000 IJ SOLN
INTRAMUSCULAR | Status: DC | PRN
  Administered 2023-06-23: 30 mL

## 2023-06-23 MED ORDER — FENTANYL CITRATE PF 50 MCG/ML IJ SOSY
PREFILLED_SYRINGE | INTRAMUSCULAR | Status: AC
  Filled 2023-06-23: qty 1

## 2023-06-23 MED ORDER — DEXAMETHASONE SODIUM PHOSPHATE 10 MG/ML IJ SOLN
INTRAMUSCULAR | Status: DC | PRN
  Administered 2023-06-23: 4 mg via INTRAVENOUS

## 2023-06-23 MED ORDER — LACTATED RINGERS IV SOLN: INTRAVENOUS | Status: DC

## 2023-06-23 MED ORDER — ACETAMINOPHEN 500 MG PO TABS
1000.0000 mg | ORAL_TABLET | Freq: Once | ORAL | Status: AC
  Administered 2023-06-23: 1000 mg via ORAL
  Filled 2023-06-23: qty 2

## 2023-06-23 MED ORDER — LIDOCAINE HCL (PF) 2 % IJ SOLN
INTRAMUSCULAR | Status: AC
  Filled 2023-06-23: qty 5

## 2023-06-23 MED ORDER — FENTANYL CITRATE (PF) 100 MCG/2ML IJ SOLN
INTRAMUSCULAR | Status: DC | PRN
Start: 2023-06-23 — End: 2023-06-23
  Administered 2023-06-23: 50 ug via INTRAVENOUS
  Administered 2023-06-23: 100 ug via INTRAVENOUS
  Administered 2023-06-23: 50 ug via INTRAVENOUS

## 2023-06-23 MED ORDER — FENTANYL CITRATE (PF) 100 MCG/2ML IJ SOLN
INTRAMUSCULAR | Status: AC
  Filled 2023-06-23: qty 2

## 2023-06-23 MED ORDER — OXYCODONE HCL 5 MG PO TABS
5.0000 mg | ORAL_TABLET | Freq: Once | ORAL | Status: AC | PRN
  Administered 2023-06-23: 5 mg via ORAL

## 2023-06-23 MED ORDER — ENOXAPARIN SODIUM 40 MG/0.4ML IJ SOSY
40.0000 mg | PREFILLED_SYRINGE | Freq: Once | INTRAMUSCULAR | Status: AC
Start: 2023-06-23 — End: 2023-06-23
  Administered 2023-06-23: 40 mg via SUBCUTANEOUS
  Filled 2023-06-23: qty 0.4

## 2023-06-23 MED ORDER — ONDANSETRON HCL 4 MG/2ML IJ SOLN
INTRAMUSCULAR | Status: AC
  Filled 2023-06-23: qty 2

## 2023-06-23 MED ORDER — CHLORHEXIDINE GLUCONATE CLOTH 2 % EX PADS: 6.0000 | MEDICATED_PAD | Freq: Once | CUTANEOUS | Status: DC

## 2023-06-23 MED ORDER — SUGAMMADEX SODIUM 200 MG/2ML IV SOLN
INTRAVENOUS | Status: DC | PRN
  Administered 2023-06-23: 200 mg via INTRAVENOUS

## 2023-06-23 MED ORDER — PROPOFOL 10 MG/ML IV BOLUS
INTRAVENOUS | Status: DC | PRN
  Administered 2023-06-23: 200 mg via INTRAVENOUS

## 2023-06-23 MED ORDER — OXYCODONE HCL 5 MG/5ML PO SOLN: 5.0000 mg | Freq: Once | ORAL | Status: AC | PRN

## 2023-06-23 MED ORDER — CHLORHEXIDINE GLUCONATE 0.12 % MT SOLN
15.0000 mL | Freq: Once | OROMUCOSAL | Status: AC
  Administered 2023-06-23: 15 mL via OROMUCOSAL

## 2023-06-23 MED ORDER — BUPIVACAINE LIPOSOME 1.3 % IJ SUSP: 20.0000 mL | Freq: Once | INTRAMUSCULAR | Status: DC

## 2023-06-23 MED ORDER — MIDAZOLAM HCL 2 MG/2ML IJ SOLN
INTRAMUSCULAR | Status: AC
  Filled 2023-06-23: qty 2

## 2023-06-23 MED ORDER — DEXAMETHASONE SODIUM PHOSPHATE 10 MG/ML IJ SOLN
INTRAMUSCULAR | Status: AC
  Filled 2023-06-23: qty 1

## 2023-06-23 SURGICAL SUPPLY — 35 items
APPLIER CLIP ROT 10 11.4 M/L (STAPLE) ×1 IMPLANT
BAG COUNTER SPONGE SURGICOUNT (BAG) IMPLANT
CABLE HIGH FREQUENCY MONO STRZ (ELECTRODE) ×1 IMPLANT
CATH URETL OPEN 5X70 (CATHETERS) IMPLANT
CHLORAPREP W/TINT 26 (MISCELLANEOUS) ×1 IMPLANT
CLIP APPLIE ROT 10 11.4 M/L (STAPLE) ×1 IMPLANT
COVER MAYO STAND XLG (MISCELLANEOUS) ×1 IMPLANT
COVER SURGICAL LIGHT HANDLE (MISCELLANEOUS) ×1 IMPLANT
DERMABOND ADVANCED .7 DNX12 (GAUZE/BANDAGES/DRESSINGS) ×1 IMPLANT
DRAPE C-ARM 42X120 X-RAY (DRAPES) IMPLANT
ELECT REM PT RETURN 15FT ADLT (MISCELLANEOUS) ×1 IMPLANT
ENDOLOOP SUT PDS II 0 18 (SUTURE) ×1 IMPLANT
GLOVE BIO SURGEON STRL SZ7.5 (GLOVE) ×1 IMPLANT
GLOVE INDICATOR 8.0 STRL GRN (GLOVE) ×1 IMPLANT
GOWN STRL REUS W/ TWL XL LVL3 (GOWN DISPOSABLE) ×2 IMPLANT
GRASPER SUT TROCAR 14GX15 (MISCELLANEOUS) IMPLANT
HEMOSTAT SNOW SURGICEL 2X4 (HEMOSTASIS) IMPLANT
IRRIG SUCT STRYKERFLOW 2 WTIP (MISCELLANEOUS) ×1 IMPLANT
IRRIGATION SUCT STRKRFLW 2 WTP (MISCELLANEOUS) ×1 IMPLANT
IV CATH 14GX2 1/4 (CATHETERS) ×1 IMPLANT
KIT BASIN OR (CUSTOM PROCEDURE TRAY) ×1 IMPLANT
KIT TURNOVER KIT A (KITS) IMPLANT
NDL INSUFFLATION 14GA 120MM (NEEDLE) ×1 IMPLANT
NEEDLE INSUFFLATION 14GA 120MM (NEEDLE) ×1 IMPLANT
POUCH RETRIEVAL ECOSAC 10 (ENDOMECHANICALS) ×1 IMPLANT
SCISSORS LAP 5X35 DISP (ENDOMECHANICALS) ×1 IMPLANT
SET TUBE SMOKE EVAC HIGH FLOW (TUBING) ×1 IMPLANT
SLEEVE Z-THREAD 5X100MM (TROCAR) ×2 IMPLANT
SPIKE FLUID TRANSFER (MISCELLANEOUS) ×1 IMPLANT
STOPCOCK 4 WAY LG BORE MALE ST (IV SETS) IMPLANT
SUT MNCRL AB 4-0 PS2 18 (SUTURE) ×1 IMPLANT
TOWEL OR 17X26 10 PK STRL BLUE (TOWEL DISPOSABLE) ×1 IMPLANT
TRAY LAPAROSCOPIC (CUSTOM PROCEDURE TRAY) ×1 IMPLANT
TROCAR ADV FIXATION 12X100MM (TROCAR) ×1 IMPLANT
TROCAR Z-THREAD OPTICAL 5X100M (TROCAR) ×1 IMPLANT

## 2023-06-23 NOTE — Op Note (Signed)
 Patient: Kevin Schultz (2001-03-30, [id number])  Date of Surgery: 06/23/2023  Preoperative Diagnosis: ACUTE CHOLECYSTITIS   Postoperative Diagnosis: ACUTE CHOLECYSTITIS   Surgical Procedure: LAPAROSCOPIC CHOLECYSTECTOMY:    Operative Team Members:  Surgeons and Role:    * Terryann Verbeek, [name], MD - Primary   Anesthesiologist: Beryle Lathe, MD CRNA: Nelle Don, CRNA   Anesthesia: General   Fluids:  Total I/O In: 900 [I.V.:800; IV Piggyback:100] Out: 5 [Blood:5]  Complications: None  Drains:  none   Specimen:  ID Type Source Tests Collected by Time Destination  1 : Gallbladder Tissue PATH Gallbladder SURGICAL PATHOLOGY Adalyna Godbee, [name], MD 06/23/2023 1540      Disposition:  PACU - hemodynamically stable.  Plan of Care: Discharge to home after PACU    Indications for Procedure: Kevin Schultz is a 23 y.o. male who presented with cholecystitis.  Laparoscopic cholecystectomy was recommended for the patient.  The procedure itself, as well as the risks, benefits and alternatives were discussed with the patient.  Risks discussed included but were not limited to the risk of infection, bleeding, damage to nearby structures, need to convert to open procedure, incisional hernia, bile leak, common bile duct injury and the need for additional procedures or surgeries.  With this discussion complete and all questions answered the patient granted consent to proceed.  Findings: Inflamed gallbladder  Infection status: Patient: Private Patient Elective Case Case: Elective Infection Present At Time Of Surgery (PATOS): None   Description of Procedure:   On the date stated above, the patient was taken to the operating room suite and placed in supine positioning.  Sequential compression devices were placed on the lower extremities to prevent blood clots.  General endotracheal anesthesia was induced. Preoperative antibiotics were given.  The patient's abdomen  was prepped and draped in the usual sterile fashion.  A time-out was completed verifying the correct patient, procedure, positioning and equipment needed for the case.  We began by anesthetizing the skin with local anesthetic and then making a 5 mm incision just below the umbilicus.  We dissected through the subcutaneous tissues to the fascia.  The fascia was grasped and elevated using a Kocher clamp.  A Veress needle was inserted into the abdomen and the abdomen was insufflated to 15 mmHg.  A 5 mm trocar was inserted in this position under optical guidance and then the abdomen was inspected.  There was no trauma to the underlying viscera with initial trocar placement.  Any abnormal findings, other than inflammation in the right upper quadrant, are listed above in the findings section.  Three additional trocars were placed, one 12 mm trocar in the subxiphoid position, one 5 mm trocar in the midline epigastric area and one 5mm trocar in the right upper quadrant subcostally.  These were placed under direct vision without any trauma to the underlying viscera.    The patient was then placed in head up, left side down positioning.  The gallbladder was identified and dissected free from its attachments to the omentum allowing the duodenum to fall away.  The infundibulum of the gallbladder was dissected free working laterally to medially.  The cystic duct and cystic artery were dissected free from surrounding connective tissue.  The infundibulum of the gallbladder was dissected off the cystic plate.  A critical view of safety was obtained with the cystic duct and cystic artery being cleared of connective tissues and clearly the only two structures entering into the gallbladder with the liver clearly visible behind.  Clips were then applied to the cystic duct and cystic artery and then these structures were divided.  A PDS endoloop was placed on the cystic duct stump. The gallbladder was dissected off the cystic plate,  placed in an endocatch bag and removed from the 12 mm subxiphoid port site.  The clips were inspected and appeared effective.  The cystic plate was inspected and hemostasis was obtained using electrocautery.  A suction irrigator was used to clean the operative field.  Attention was turned to closure.  The 12 mm subxiphoid port site was closed using a 0-vicryl suture on a fascial suture passer.  The abdomen was desufflated.  The skin was closed using 4-0 monocryl and dermabond.  All sponge and needle counts were correct at the conclusion of the case.    Ivar Drape, MD General, Bariatric, & Minimally Invasive Surgery [facility], [place]

## 2023-06-23 NOTE — Anesthesia Procedure Notes (Signed)
 Procedure Name: Intubation Date/Time: 06/23/2023 3:21 PM  Performed by: Nelle Don, CRNAPre-anesthesia Checklist: Patient identified, Emergency Drugs available, Suction available and Patient being monitored Patient Re-evaluated:Patient Re-evaluated prior to induction Oxygen Delivery Method: Circle system utilized Preoxygenation: Pre-oxygenation with 100% oxygen Induction Type: IV induction Ventilation: Mask ventilation without difficulty Laryngoscope Size: Mac and 4 Grade View: Grade I Tube type: Oral Tube size: 7.5 mm Number of attempts: 1 Airway Equipment and Method: Stylet Placement Confirmation: ETT inserted through vocal cords under direct vision, positive ETCO2 and breath sounds checked- equal and bilateral Secured at: 23 cm Tube secured with: Tape Dental Injury: Teeth and Oropharynx as per pre-operative assessment

## 2023-06-23 NOTE — Progress Notes (Signed)
 Patient noted to have rash on his upper chest. Patient denied itching, and shortness of breath. MD states that based on lead placement, rash is likely from adhesive. Rash noted to be resolving on it's own after leads were removed. MD educated patient to take Tylenol at home is rash persists or if he becomes itchy.

## 2023-06-23 NOTE — Anesthesia Preprocedure Evaluation (Addendum)
 Anesthesia Evaluation  Patient identified by MRN, date of birth, ID band Patient awake    Reviewed: Allergy & Precautions, NPO status , Patient's Chart, lab work & pertinent test results  History of Anesthesia Complications Negative for: history of anesthetic complications  Airway Mallampati: II  TM Distance: >3 FB Neck ROM: Full    Dental  (+) Dental Advisory Given   Pulmonary former smoker   Pulmonary exam normal        Cardiovascular negative cardio ROS Normal cardiovascular exam     Neuro/Psych Seizures -, Well Controlled,  PSYCHIATRIC DISORDERS Anxiety        GI/Hepatic ,GERD  Controlled,,(+)     substance abuse  alcohol use and marijuana use  Endo/Other   Obesity   Renal/GU negative Renal ROS     Musculoskeletal negative musculoskeletal ROS (+)    Abdominal   Peds  Hematology negative hematology ROS (+)   Anesthesia Other Findings 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  Reproductive/Obstetrics                             Anesthesia Physical Anesthesia Plan  ASA: 2  Anesthesia Plan: General   Post-op Pain Management: Tylenol PO (pre-op)*   Induction: Intravenous  PONV Risk Score and Plan: 2 and Treatment may vary due to age or medical condition, Ondansetron, Dexamethasone and Midazolam  Airway Management Planned: Oral ETT  Additional Equipment: None  Intra-op Plan:   Post-operative Plan: Extubation in OR  Informed Consent: I have reviewed the patients History and Physical, chart, labs and discussed the procedure including the risks, benefits and alternatives for the proposed anesthesia with the patient or authorized representative who has indicated his/her understanding and acceptance.     Dental advisory given  Plan Discussed with: CRNA and Anesthesiologist  Anesthesia Plan Comments:        Anesthesia Quick Evaluation

## 2023-06-23 NOTE — Transfer of Care (Signed)
 Immediate Anesthesia Transfer of Care Note  Patient: Kevin Schultz  Procedure(s) Performed: LAPAROSCOPIC CHOLECYSTECTOMY  Patient Location: PACU  Anesthesia Type:General  Level of Consciousness: drowsy  Airway & Oxygen Therapy: Patient Spontanous Breathing and Patient connected to face mask oxygen  Post-op Assessment: Report given to RN, Post -op Vital signs reviewed and stable, and Patient moving all extremities X 4  Post vital signs: Reviewed and stable  Last Vitals:  Vitals Value Taken Time  BP 150/84   Temp    Pulse 108   Resp 12   SpO2 100     Last Pain:  Vitals:   06/23/23 1334  TempSrc: Oral  PainSc: 0-No pain         Complications: No notable events documented.

## 2023-06-23 NOTE — Anesthesia Postprocedure Evaluation (Signed)
 Anesthesia Post Note  Patient: Kevin Schultz  Procedure(s) Performed: LAPAROSCOPIC CHOLECYSTECTOMY     Patient location during evaluation: PACU Anesthesia Type: General Level of consciousness: awake and alert Pain management: pain level controlled Vital Signs Assessment: post-procedure vital signs reviewed and stable Respiratory status: spontaneous breathing, nonlabored ventilation and respiratory function stable Cardiovascular status: blood pressure returned to baseline and stable Postop Assessment: no apparent nausea or vomiting Anesthetic complications: no   No notable events documented.  Last Vitals:  Vitals:   06/23/23 1715 06/23/23 1730  BP: (!) 139/94 139/89  Pulse: 87 76  Resp: (!) 24 14  Temp:    SpO2: 95% 100%    Last Pain:  Vitals:   06/23/23 1752  TempSrc:   PainSc: 6                  Beryle Lathe

## 2023-06-23 NOTE — H&P (Signed)
   Admitting Physician: [name] Curtisha Bendix  Service: General Surgery  CC: Cholecystitis  Subjective   HPI: Kevin Schultz is an 23 y.o. 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 His smoking use included cigarettes and cigars. He has never used smokeless tobacco. He reports current alcohol use. He reports that he does not currently use drugs after having used the following drugs: Marijuana.  Allergies: No Known Allergies  Medications: Current Outpatient Medications  Medication Instructions   diazepam (VALIUM) 5 mg, Oral, Every 6 hours PRN   ibuprofen (ADVIL) 800 mg, Oral, 3 times daily PRN    ROS - all of the below systems have been reviewed with the patient and positives are indicated with bold text General: chills, fever or night sweats Eyes: blurry vision or double vision ENT: epistaxis or sore throat Allergy/Immunology: itchy/watery eyes or nasal congestion Hematologic/Lymphatic: bleeding problems, blood clots or swollen lymph nodes Endocrine: temperature intolerance or unexpected weight changes Breast: new or changing breast lumps or nipple discharge Resp: cough, shortness of breath, or wheezing CV: chest pain or dyspnea on exertion GI: as per HPI GU: dysuria, trouble voiding, or hematuria MSK: joint pain or joint stiffness Neuro: TIA or stroke symptoms Derm: pruritus and skin lesion changes Psych: anxiety and depression  Objective   PE Blood pressure 134/75, pulse 96, temperature 98.4 F  (36.9 C), temperature source Oral, resp. rate 18, height 5\' 5"  (1.651 m), weight 86.2 kg, SpO2 96%. Constitutional: NAD; conversant; no deformities Eyes: Moist conjunctiva; no lid lag; anicteric; PERRL Neck: Trachea midline; no thyromegaly Lungs: Normal respiratory effort; no tactile fremitus CV: RRR; no palpable thrills; no pitting edema GI: Abd Soft, nontender; no palpable hepatosplenomegaly MSK: Normal range of motion of extremities; no clubbing/cyanosis Psychiatric: Appropriate affect; alert and oriented x3 Lymphatic: No palpable cervical or axillary lymphadenopathy  No results found for this or any previous visit (from the past 24 hours).  Imaging Orders  No imaging studies ordered today  RUQ [place] 04/21/23 Dilated gallbladder with wall thickening and wall edema. There are no shadowing stones. Please correlate for any clinical evidence of acalculous cholecystitis or other process. If needed further workup with a HIDA scan could be considered. No biliary ductal dilatation.  CT Abd/Pel 04/21/23 Minimal to mild gallbladder wall thickening is noted without cholelithiasis. Ultrasound may be performed for further evaluation.  No other abnormality seen in the abdomen or pelvis.  HIDA 05/13/23 Nonvisualization of the gallbladder compatible with cystic duct obstruction due to cholecystitis.    Assessment and Plan   Kevin Schultz is an 23 y.o. male with cholecystitis.  I recommended laparoscopic cholecystectomy.  We discussed the procedure, its risks, benefits and alternatives and the patient granted consent to proceed.   Quentin Ore, MD  [facility] Use [email or web address] to contact on call provider

## 2023-06-23 NOTE — Discharge Instructions (Signed)
 CHOLECYSTECTOMY POST OPERATIVE INSTRUCTIONS  Thinking Clearly  The anesthesia may cause you to feel different for 1 or 2 days. Do not drive, drink alcohol, or make any big decisions for at least 2 days.  Nutrition When you wake up, you will be able to drink small amounts of liquid. If you do not feel sick, you can slowly advance your diet to regular foods. Continue to drink lots of fluids, usually about 8 to 10 glasses per day. Eat a high-fiber diet so you don't strain during bowel movements. High-Fiber Foods Foods high in fiber include beans, bran cereals and whole-grain breads, peas, dried fruit (figs, apricots, and dates), raspberries, blackberries, strawberries, sweet corn, broccoli, baked potatoes with skin, plums, pears, apples, greens, and nuts. Activity Slowly increase your activity. Be sure to get up and walk every hour or so to prevent blood clots. No heavy lifting or strenuous activity for 4 weeks following surgery to prevent hernias at your incision sites It is normal to feel tired. You may need more sleep than usual.  Get your rest but make sure to get up and move around frequently to prevent blood clots and pneumonia.  Work and Return to [employer] can go back to work when you feel well enough. Discuss the timing with your surgeon. You can usually go back to school or work 1 week after an operation. If your work requires heavy lifting or strenuous activity you need to be placed on light duty for 4 weeks following surgery. You can return to gym class, sports or other physical activities 4 weeks after surgery.  Wound Care Always wash your hands before and after touching near your incision site. Do not soak in a bathtub until cleared at your follow up appointment. You may take a shower 24 hours after surgery. A small amount of drainage from the incision is normal. If the drainage is thick and yellow or the site is red, you may have an infection, so call your surgeon. If you  have a drain in one of your incisions, it will be taken out in office when the drainage stops. Steri-Strips will fall off in 7 to 10 days or they will be removed during your first office visit. If you have dermabond glue covering over the incision, allow the glue to flake off on its own. Avoid wearing tight or rough clothing. It may rub your incisions and make it harder for them to heal. Protect the new skin, especially from the sun. The sun can burn and cause darker scarring. Your scar will heal in about 4 to 6 weeks and will become softer and continue to fade over the next year.  The cosmetic appearance of the incisions will improve over the course of the first year after surgery. Sensation around your incision will return in a few weeks or months.  Bowel Movements After intestinal surgery, you may have loose watery stools for several days. If watery diarrhea lasts longer than 3 days, contact your surgeon. Pain medication (narcotics) can cause constipation. Increase the fiber in your diet with high-fiber foods if you are constipated. You can take an over the counter stool softener like Colace to avoid constipation.  Additional over the counter medications can also be used if Colace isn't sufficient (for example, Milk of Magnesia or Miralax).  Pain The amount of pain is different for each person. Some people need only 1 to 3 doses of pain control medication, while others need more. Take alternating doses of tylenol  and ibuprofen around the clock for the first five days following surgery.  This will provide a baseline of pain control and help with inflammation.  Take the narcotic pain medication in addition if needed for severe pain.  Contact Your Surgeon at [phone], if you have: Pain in your right upper abdomen like a gallbladder attack. Pain that will not go away Pain that gets worse A fever of more than 101F (38.3C) Repeated vomiting Swelling, redness, bleeding, or bad-smelling  drainage from your wound site Strong abdominal pain No bowel movement or unable to pass gas for 3 days Watery diarrhea lasting longer than 3 days  Pain Control The goal of pain control is to minimize pain, keep you moving and help you heal. Your surgical team will work with you on your pain plan. Most often a combination of therapies and medications are used to control your pain. You may also be given medication (local anesthetic) at the surgical site. This may help control your pain for several days. Extreme pain puts extra stress on your body at a time when your body needs to focus on healing. Do not wait until your pain has reached a level "10" or is unbearable before telling your doctor or nurse. It is much easier to control pain before it becomes severe. Following a laparoscopic procedure, pain is sometimes felt in the shoulder. This is due to the gas inserted into your abdomen during the procedure. Moving and walking helps to decrease the gas and the right shoulder pain.  Use the guide below for ways to manage your post-operative pain. Learn more by going to facs.org/safepaincontrol.  How Intense Is My Pain Common Therapies to Feel Better       I hardly notice my pain, and it does not interfere with my activities.  I notice my pain and it distracts me, but I can still do activities (sitting up, walking, standing).  Non-Medication Therapies  Ice (in a bag, applied over clothing at the surgical site), elevation, rest, meditation, massage, distraction (music, TV, play) walking and mild exercise Splinting the abdomen with pillows +  Non-Opioid Medications Acetaminophen (Tylenol) Non-steroidal anti-inflammatory drugs (NSAIDS) Aspirin, Ibuprofen (Motrin, Advil) Naproxen (Aleve) Take these as needed, when you feel pain. Both acetaminophen and NSAIDs help to decrease pain and swelling (inflammation).      My pain is hard to ignore and is more noticeable even when I rest.  My  pain interferes with my usual activities.  Non-Medication Therapies  +  Non-Opioid medications  Take on a regular schedule (around-the-clock) instead of as needed. (For example, Tylenol every 6 hours at 9:00 am, 3:00 pm, 9:00 pm, 3:00 am and Motrin every 6 hours at 12:00 am, 6:00 am, 12:00 pm, 6:00 pm)         I am focused on my pain, and I am not doing my daily activities.  I am groaning in pain, and I cannot sleep. I am unable to do anything.  My pain is as bad as it could be, and nothing else matters.  Non-Medication Therapies  +  Around-the-Clock Non-Opioid Medications  +  Short-acting opioids  Opioids should be used with other medications to manage severe pain. Opioids block pain and give a feeling of euphoria (feel high). Addiction, a serious side effect of opioids, is rare with short-term (a few days) use.  Examples of short-acting opioids include: Tramadol (Ultram), Hydrocodone (Norco, Vicodin), Hydromorphone (Dilaudid), Oxycodone (Oxycontin)     The above directions have been adapted from  the [employer] of [employer] Surgical Patient Education Program.  Please refer to the ACS website if needed: [email or web address].[email or web address].   Ivar Drape, MD [facility] [address], [address], [place], [place]  [address] ?  [address] [address], [place], [place]   [address] [phone] ? [phone] ? FAX [phone] Web site: [email or web address]

## 2023-06-24 ENCOUNTER — Emergency Department (HOSPITAL_COMMUNITY)

## 2023-06-24 ENCOUNTER — Encounter (HOSPITAL_COMMUNITY): Payer: Self-pay | Admitting: Surgery

## 2023-06-24 ENCOUNTER — Emergency Department (HOSPITAL_COMMUNITY)
Admission: EM | Admit: 2023-06-24 | Discharge: 2023-06-24 | Disposition: A | Discharge status: Home / Self Care | Attending: Emergency Medicine | Admitting: Emergency Medicine

## 2023-06-24 DIAGNOSIS — K920 Hematemesis: Secondary | ICD-10-CM | POA: Diagnosis present

## 2023-06-24 DIAGNOSIS — R000 Tachycardia, unspecified: Secondary | ICD-10-CM | POA: Diagnosis not present

## 2023-06-24 DIAGNOSIS — K91870 Postprocedural hematoma of a digestive system organ or structure following a digestive system procedure: Secondary | ICD-10-CM | POA: Insufficient documentation

## 2023-06-24 DIAGNOSIS — E876 Hypokalemia: Secondary | ICD-10-CM | POA: Diagnosis not present

## 2023-06-24 DIAGNOSIS — M546 Pain in thoracic spine: Secondary | ICD-10-CM | POA: Diagnosis not present

## 2023-06-24 DIAGNOSIS — D72829 Elevated white blood cell count, unspecified: Secondary | ICD-10-CM | POA: Diagnosis not present

## 2023-06-24 DIAGNOSIS — M25511 Pain in right shoulder: Secondary | ICD-10-CM | POA: Insufficient documentation

## 2023-06-24 LAB — CBC WITH DIFFERENTIAL/PLATELET
Abs Immature Granulocytes: 0.06 10*3/uL (ref 0.00–0.07)
Abs Immature Granulocytes: 0.03 10*3/uL (ref 0.00–0.07)
Abs Immature Granulocytes: 0.06 10*3/uL (ref 0.00–0.07)
Basophils Absolute: 0 10*3/uL (ref 0.0–0.1)
Basophils Absolute: 0 10*3/uL (ref 0.0–0.1)
Basophils Absolute: 0 10*3/uL (ref 0.0–0.1)
Basophils Relative: 0 %
Basophils Relative: 0 %
Basophils Relative: 0 %
Eosinophils Absolute: 0 10*3/uL (ref 0.0–0.5)
Eosinophils Absolute: 0 10*3/uL (ref 0.0–0.5)
Eosinophils Absolute: 0 10*3/uL (ref 0.0–0.5)
Eosinophils Relative: 0 %
Eosinophils Relative: 0 %
Eosinophils Relative: 0 %
HCT: 41.8 % (ref 39.0–52.0)
HCT: 23.2 % — ABNORMAL LOW (ref 39.0–52.0)
HCT: 33.4 % — ABNORMAL LOW (ref 39.0–52.0)
Hemoglobin: 14.7 g/dL (ref 13.0–17.0)
Hemoglobin: 7.7 g/dL — ABNORMAL LOW (ref 13.0–17.0)
Hemoglobin: 11.4 g/dL — ABNORMAL LOW (ref 13.0–17.0)
Immature Granulocytes: 0 %
Immature Granulocytes: 0 %
Immature Granulocytes: 0 %
Lymphocytes Relative: 14 %
Lymphocytes Relative: 24 %
Lymphocytes Relative: 24 %
Lymphs Abs: 2.6 10*3/uL (ref 0.7–4.0)
Lymphs Abs: 2.4 10*3/uL (ref 0.7–4.0)
Lymphs Abs: 3.7 10*3/uL (ref 0.7–4.0)
MCH: 32.8 pg (ref 26.0–34.0)
MCH: 32.4 pg (ref 26.0–34.0)
MCH: 32.3 pg (ref 26.0–34.0)
MCHC: 35.2 g/dL (ref 30.0–36.0)
MCHC: 33.2 g/dL (ref 30.0–36.0)
MCHC: 34.1 g/dL (ref 30.0–36.0)
MCV: 93.3 fL (ref 80.0–100.0)
MCV: 97.5 fL (ref 80.0–100.0)
MCV: 94.6 fL (ref 80.0–100.0)
Monocytes Absolute: 1.7 10*3/uL — ABNORMAL HIGH (ref 0.1–1.0)
Monocytes Absolute: 1 10*3/uL (ref 0.1–1.0)
Monocytes Absolute: 1.4 10*3/uL — ABNORMAL HIGH (ref 0.1–1.0)
Monocytes Relative: 9 %
Monocytes Relative: 10 %
Monocytes Relative: 10 %
Neutro Abs: 14.3 10*3/uL — ABNORMAL HIGH (ref 1.7–7.7)
Neutro Abs: 6.5 10*3/uL (ref 1.7–7.7)
Neutro Abs: 9.9 10*3/uL — ABNORMAL HIGH (ref 1.7–7.7)
Neutrophils Relative %: 77 %
Neutrophils Relative %: 66 %
Neutrophils Relative %: 66 %
Platelets: 390 10*3/uL (ref 150–400)
Platelets: 207 10*3/uL (ref 150–400)
Platelets: 316 10*3/uL (ref 150–400)
RBC: 4.48 MIL/uL (ref 4.22–5.81)
RBC: 2.38 MIL/uL — ABNORMAL LOW (ref 4.22–5.81)
RBC: 3.53 MIL/uL — ABNORMAL LOW (ref 4.22–5.81)
RDW: 12.3 % (ref 11.5–15.5)
RDW: 12.5 % (ref 11.5–15.5)
RDW: 12.4 % (ref 11.5–15.5)
WBC: 18.8 10*3/uL — ABNORMAL HIGH (ref 4.0–10.5)
WBC: 9.9 10*3/uL (ref 4.0–10.5)
WBC: 15.1 10*3/uL — ABNORMAL HIGH (ref 4.0–10.5)
nRBC: 0 % (ref 0.0–0.2)
nRBC: 0 % (ref 0.0–0.2)
nRBC: 0 % (ref 0.0–0.2)

## 2023-06-24 LAB — ABO/RH: ABO/RH(D): O POS

## 2023-06-24 LAB — COMPREHENSIVE METABOLIC PANEL
ALT: 29 U/L (ref 0–44)
AST: 35 U/L (ref 15–41)
Albumin: 4.8 g/dL (ref 3.5–5.0)
Alkaline Phosphatase: 71 U/L (ref 38–126)
Anion gap: 12 (ref 5–15)
BUN: 20 mg/dL (ref 6–20)
CO2: 25 mmol/L (ref 22–32)
Calcium: 9.5 mg/dL (ref 8.9–10.3)
Chloride: 100 mmol/L (ref 98–111)
Creatinine, Ser: 0.81 mg/dL (ref 0.61–1.24)
GFR, Estimated: >60 mL/min (ref >60)
Glucose, Bld: 108 mg/dL — ABNORMAL HIGH (ref 70–99)
Potassium: 3.3 mmol/L — ABNORMAL LOW (ref 3.5–5.1)
Sodium: 137 mmol/L (ref 135–145)
Total Bilirubin: 1.3 mg/dL — ABNORMAL HIGH (ref 0.0–1.2)
Total Protein: 8.3 g/dL — ABNORMAL HIGH (ref 6.5–8.1)

## 2023-06-24 LAB — TYPE AND SCREEN
ABO/RH(D): O POS
Antibody Screen: NEGATIVE

## 2023-06-24 LAB — MAGNESIUM: Magnesium: 2.3 mg/dL (ref 1.7–2.4)

## 2023-06-24 LAB — PROTIME-INR
INR: 1.1 (ref 0.8–1.2)
Prothrombin Time: 14.1 s (ref 11.4–15.2)

## 2023-06-24 LAB — PHOSPHORUS: Phosphorus: 2.7 mg/dL (ref 2.5–4.6)

## 2023-06-24 LAB — LIPASE, BLOOD: Lipase: 26 U/L (ref 11–51)

## 2023-06-24 MED ORDER — LACTATED RINGERS IV BOLUS
1000.0000 mL | Freq: Once | INTRAVENOUS | Status: AC
  Administered 2023-06-24: 1000 mL via INTRAVENOUS

## 2023-06-24 MED ORDER — ONDANSETRON HCL 4 MG/2ML IJ SOLN
4.0000 mg | Freq: Once | INTRAMUSCULAR | Status: AC
  Administered 2023-06-24: 4 mg via INTRAVENOUS
  Filled 2023-06-24: qty 2

## 2023-06-24 MED ORDER — HYDROCODONE-ACETAMINOPHEN 5-325 MG PO TABS
1.0000 | ORAL_TABLET | Freq: Four times a day (QID) | ORAL | 0 refills | Status: AC | PRN
Start: 2023-06-24 — End: ?

## 2023-06-24 MED ORDER — SALINE SPRAY 0.65 % NA SOLN: 1.0000 | Freq: Four times a day (QID) | NASAL | Status: DC | PRN

## 2023-06-24 MED ORDER — NAPHAZOLINE-GLYCERIN 0.012-0.25 % OP SOLN: 1.0000 [drp] | Freq: Four times a day (QID) | OPHTHALMIC | Status: DC | PRN

## 2023-06-24 MED ORDER — SODIUM CHLORIDE 0.9 % IV BOLUS
1000.0000 mL | Freq: Once | INTRAVENOUS | Status: AC
  Administered 2023-06-24: 1000 mL via INTRAVENOUS

## 2023-06-24 MED ORDER — PHENOL 1.4 % MT LIQD: 2.0000 | OROMUCOSAL | Status: DC | PRN

## 2023-06-24 MED ORDER — PANTOPRAZOLE SODIUM 40 MG IV SOLR
40.0000 mg | Freq: Once | INTRAVENOUS | Status: AC
  Administered 2023-06-24: 40 mg via INTRAVENOUS
  Filled 2023-06-24: qty 10

## 2023-06-24 MED ORDER — METOCLOPRAMIDE HCL 10 MG PO TABS
10.0000 mg | ORAL_TABLET | Freq: Once | ORAL | Status: AC
  Administered 2023-06-24: 10 mg via ORAL
  Filled 2023-06-24: qty 1

## 2023-06-24 MED ORDER — METOCLOPRAMIDE HCL 5 MG/ML IJ SOLN
10.0000 mg | Freq: Once | INTRAMUSCULAR | Status: DC
  Filled 2023-06-24: qty 2

## 2023-06-24 MED ORDER — DIPHENHYDRAMINE HCL 50 MG/ML IJ SOLN
25.0000 mg | Freq: Once | INTRAMUSCULAR | Status: AC
  Administered 2023-06-24: 25 mg via INTRAVENOUS
  Filled 2023-06-24: qty 1

## 2023-06-24 MED ORDER — ESOMEPRAZOLE MAGNESIUM 40 MG PO CPDR: 40.0000 mg | DELAYED_RELEASE_CAPSULE | Freq: Every day | ORAL | 0 refills | Status: AC

## 2023-06-24 MED ORDER — ONDANSETRON HCL 4 MG/2ML IJ SOLN: 4.0000 mg | Freq: Four times a day (QID) | INTRAMUSCULAR | Status: DC | PRN

## 2023-06-24 MED ORDER — LACTATED RINGERS IV BOLUS: 1000.0000 mL | Freq: Three times a day (TID) | INTRAVENOUS | Status: DC | PRN

## 2023-06-24 MED ORDER — IOHEXOL 300 MG/ML  SOLN
100.0000 mL | Freq: Once | INTRAMUSCULAR | Status: AC | PRN
  Administered 2023-06-24: 100 mL via INTRAVENOUS

## 2023-06-24 MED ORDER — METOCLOPRAMIDE HCL 5 MG/ML IJ SOLN
10.0000 mg | Freq: Four times a day (QID) | INTRAMUSCULAR | Status: DC
  Administered 2023-06-24: 10 mg via INTRAVENOUS

## 2023-06-24 MED ORDER — METOCLOPRAMIDE HCL 10 MG PO TABS
10.0000 mg | ORAL_TABLET | Freq: Four times a day (QID) | ORAL | 3 refills | Status: AC | PRN
Start: 2023-06-24 — End: ?

## 2023-06-24 MED ORDER — MORPHINE SULFATE (PF) 4 MG/ML IV SOLN
4.0000 mg | Freq: Once | INTRAVENOUS | Status: DC
  Filled 2023-06-24: qty 1

## 2023-06-24 MED ORDER — POTASSIUM CHLORIDE CRYS ER 20 MEQ PO TBCR
40.0000 meq | EXTENDED_RELEASE_TABLET | Freq: Once | ORAL | Status: AC
  Administered 2023-06-24: 40 meq via ORAL
  Filled 2023-06-24: qty 2

## 2023-06-24 MED ORDER — MENTHOL 3 MG MT LOZG
1.0000 | LOZENGE | OROMUCOSAL | Status: DC | PRN
Start: 2023-06-24 — End: 2023-06-25

## 2023-06-24 MED ORDER — MORPHINE SULFATE (PF) 4 MG/ML IV SOLN
4.0000 mg | Freq: Once | INTRAVENOUS | Status: AC
  Administered 2023-06-24: 4 mg via INTRAVENOUS
  Filled 2023-06-24: qty 1

## 2023-06-24 MED ORDER — MAGIC MOUTHWASH: 15.0000 mL | Freq: Four times a day (QID) | ORAL | Status: DC | PRN

## 2023-06-24 MED ORDER — BISACODYL 10 MG RE SUPP
10.0000 mg | Freq: Every day | RECTAL | Status: DC
  Administered 2023-06-24: 10 mg via RECTAL
  Filled 2023-06-24: qty 1

## 2023-06-24 MED ORDER — PROCHLORPERAZINE EDISYLATE 10 MG/2ML IJ SOLN: 5.0000 mg | INTRAMUSCULAR | Status: DC | PRN

## 2023-06-24 NOTE — ED Provider Notes (Signed)
  Physical Exam  BP (!) 145/86 (BP Location: Right Arm)   Pulse (!) 102   Temp 98.2 F (36.8 C) (Oral)   Resp 18   SpO2 97%   Physical Exam  Procedures  Procedures  ED Course / MDM   Clinical Course as of 06/24/23 1646  Wed Jun 24, 2023  1433 Mild hypokalemia and mildly elevated bili, Hgb stable, leukocytosis may be reactionary from recent procedure. CT is pending. [VK]  1526 Patient signed out to Dr. Silverio Lay in stable condition pending CT read. [VK]    Clinical Course User Index [VK] Rexford Maus, DO   Medical Decision Making Care assumed at 3 PM.  Patient had cholecystectomy by Dr. Alphia Moh yesterday.  Patient was sent home after surgery and was not admitted to the hospital.  Around 7 PM, patient started vomiting and vomited a cupful of bright red blood.  Signout pending CT chest abdomen pelvis and surgery evaluation   4:47 PM CT showed possible esophagitis and air in the abdomen consistent with recent laparoscopic procedure.  I discussed with Casimiro Needle, [place] with surgery to see patient.   5:30 pm Dr. Michaell Cowing saw the patient.  He recommend more fluids and Reglan and p.o. trial.  He has prescribed Reglan and Vicodin for pain and nausea.  6:30 pm Repeat hemoglobin shows 7.7 down from 14.  It was drawn from the line so we will repeat it  8:19 PM Hemoglobin is 11.4 down from 14.  However he received 4 L bolus already so this is expected.  Patient has no further episodes of hematemesis.  Per Dr. Michaell Cowing, this is likely an ongoing issue and he may have Mallory-Weiss tear.  Given that he is hemodynamically stable and able to tolerate p.o., he recommend discharge with GI follow-up.  Will start patient on PPI and continue Reglan as needed and take pain medicine  Problems Addressed: Hematemesis with nausea: acute illness or injury  Amount and/or Complexity of Data Reviewed Labs: ordered. Decision-making details documented in ED Course. Radiology: ordered and independent  interpretation performed. Decision-making details documented in ED Course.  Risk Prescription drug management.          Charlynne Pander, MD 06/24/23 2020

## 2023-06-24 NOTE — ED Triage Notes (Signed)
 Pt BIBA from home for vomiting blood post surgery yesterday.   Pt took zofran for nausea post surgery after eating.  Emesis was food and greenish Pt felt nausea around 1140, took oxycodone and zofran.  Pt vomited dark blood.    Pt states pain in abdomen, right shoulder, and midline back  V/S 140/80, 116HR, 99%,133CBG, 97 Temp.   Pt endorses dizziness, denies loss of consciousness.

## 2023-06-24 NOTE — Discharge Instructions (Addendum)
 We have prescribed Reglan as needed for nausea.  Dr. Michaell Cowing also prescribed Norco as needed for pain. Take Nexium daily.  Call tomorrow for appointment in the surgery office.  Please also call your GI doctor for follow-up.  Return to ER if you have severe abdominal pain or vomiting blood   ################################################################  LAPAROSCOPIC SURGERY: POST OP INSTRUCTIONS  ######################################################################  EAT Gradually transition to a high fiber diet with a fiber supplement over the next few weeks after discharge.  Start with a pureed / full liquid diet (see below)  WALK Walk an hour a day.  Control your pain to do that.    CONTROL PAIN Control pain so that you can walk, sleep, tolerate sneezing/coughing, go up/down stairs.  HAVE A BOWEL MOVEMENT DAILY Keep your bowels regular to avoid problems.  OK to try a laxative to override constipation.  OK to use an antidairrheal to slow down diarrhea.  Call if not better after 2 tries  CALL IF YOU HAVE PROBLEMS/CONCERNS Call if you are still struggling despite following these instructions. Call if you have concerns not answered by these instructions  ######################################################################    DIET: Follow a light bland diet & liquids the first 24 hours after arrival home, such as soup, liquids, starches, etc.  Be sure to drink plenty of fluids.  Quickly advance to a usual solid diet within a few days.  Avoid fast food or heavy meals as your are more likely to get nauseated or have irregular bowels.  A low-fat, high-fiber diet for the rest of your life is ideal.  Take your usually prescribed home medications unless otherwise directed. Blood thinners:  You can restart any strong blood thinners after the second postoperative day  for example: COUMADIN (warfarin), XERELTO (rivaroxaban), ELIQUIS (apixaban), PLAVIX (clopidigrel), BRILINTA  (ticagrelor), EFFIENT (prasugrel), PRADAXA (dabigatran), etc  Continue aspirin before & after surgery..     Some oozing/bleeding the first 1-2 weeks is common but should taper down & be small volume.    If you are passing many large clots or having uncontrolling bleeding, call your surgeon  PAIN CONTROL: Pain is best controlled by a usual combination of three different methods TOGETHER: Ice/Heat Over the counter pain medication Prescription pain medication Most patients will experience some swelling and bruising around the incisions.  Ice packs or heating pads (30-60 minutes up to 6 times a day) will help. Use ice for the first few days to help decrease swelling and bruising, then switch to heat to help relax tight/sore spots and speed recovery.  Some people prefer to use ice alone, heat alone, alternating between ice & heat.  Experiment to what works for you.  Swelling and bruising can take several weeks to resolve.   It is helpful to take an over-the-counter pain medication regularly for the first few weeks.  Choose one of the following that works best for you: Naproxen (Aleve, etc)  Two 220mg  tabs twice a day Ibuprofen (Advil, etc) Three 200mg  tabs four times a day (every meal & bedtime) Acetaminophen (Tylenol, etc) 500-650mg  four times a day (every meal & bedtime) A  prescription for pain medication (such as oxycodone, hydrocodone, tramadol, gabapentin, methocarbamol, etc) should be given to you upon discharge.  Take your pain medication as prescribed.  If you are having problems/concerns with the prescription medicine (does not control pain, nausea, vomiting, rash, itching, etc), please call us [phone] to see if we need to switch you to a different pain medicine that will work better  for you and/or control your side effect better. If you need a refill on your pain medication, please give [place] 48 hour notice.  contact your pharmacy.  They will contact our office to request authorization.  Prescriptions will not be filled after 5 pm or on week-ends  AVOID GETTING CONSTIPATED.   a.  Between the surgery and the pain medications, it is common to experience some constipation.  b.  Drink plenty of liquids c   ake a fiber supplement 2 times day (such as Metamucil, Citrucel, FiberCon, MiraLax, etc) to have a bowel movement every day. d.  If you have not had a BM by 2 days after surgery: -drink liquids only until you have a bowel movement - take MiraLAX 2 doses every 2 hours until you have a bowel movement   Watch out for diarrhea.   If you have many loose bowel movements, simplify your diet to bland foods & liquids for a few days.   Stop any stool softeners and decrease your fiber supplement.   Switching to mild anti-diarrheal medications (Kayopectate, Pepto Bismol) can help.   If this worsens or does not improve, please call us.  Wash / shower every day.  You may shower over the dressings as they are waterproof.  Continue to shower over incision(s) after the dressing is off.  It is good for closed incisions and even open wounds to be washed every day.  Shower every day.  Short baths are fine.  Wash the incisions and wounds clean with soap & water.    You may leave closed incisions open to air if it is dry.   You may cover the incision with clean gauze & replace it after your daily shower for comfort.  DERMABOND:  You have purple skin glue (Dermabond) on your incision(s).  Leave them in place, and they will fall off on their own like a scab in 2-3 weeks.  You may trim any edges that curl up with clean scissors.    ACTIVITIES as tolerated:   You may resume regular (light) daily activities beginning the next day--such as daily self-care, walking, climbing stairs--gradually increasing activities as tolerated.  If you can walk 30 minutes without difficulty, it is safe to try more intense activity such as jogging, treadmill, bicycling, low-impact aerobics, swimming, etc. Save the most  intensive and strenuous activity for last such as sit-ups, heavy lifting, contact sports, etc  Refrain from any heavy lifting or straining until you are off narcotics for pain control.   DO NOT PUSH THROUGH PAIN.  Let pain be your guide: If it hurts to do something, don't do it.  Pain is your body warning you to avoid that activity for another week until the pain goes down. You may drive when you are no longer taking prescription pain medication, you can comfortably wear a seatbelt, and you can safely maneuver your car and apply brakes. You may have sexual intercourse when it is comfortable.  FOLLOW UP in our office Please call [facility] at [phone] to set up an appointment to see your surgeon in the office for a follow-up appointment approximately 2-3 weeks after your surgery. Make sure that you call for this appointment the day you arrive home to insure a convenient appointment time.  10. IF YOU HAVE DISABILITY OR FAMILY LEAVE FORMS, BRING THEM TO THE OFFICE FOR PROCESSING.  DO NOT GIVE THEM TO YOUR DOCTOR.   WHEN TO CALL us [phone]: Poor pain control Reactions / problems  with new medications (rash/itching, nausea, etc)  Fever over 101.5 F (38.5 C) Inability to urinate Nausea and/or vomiting Worsening swelling or bruising Continued bleeding from incision. Increased pain, redness, or drainage from the incision   The clinic staff is available to answer your questions during regular business hours (8:30am-5pm).  Please don't hesitate to call and ask to speak to one of our nurses for clinical concerns.   If you have a medical emergency, go to the nearest emergency room or call 911.  A surgeon from [facility] is always on call at the [facility], [place] [address], [address], [place], [place]  [address] ? MAIN: [phone] ? TOLL FREE: [phone] ?  FAX [phone]  [phone] [email or web address]  ##############################################################

## 2023-06-24 NOTE — Progress Notes (Signed)
 Kevin Schultz  27-Dec-2000 [id number]  CARE TEAM:  PCP: Kevin Sessions, NP  Outpatient Care Team: Patient Care Team: Kevin Sessions, NP as PCP - General (Internal Medicine) [name], [name], MD as Consulting Physician (Surgery) [name], [name], MD as Consulting Physician (Gastroenterology)  Inpatient Treatment Team: Treatment Team:  Charlynne Pander, MD [name], RN [name], [place] [facility], Md, MD [name], [name], MD   This patient is a 23 y.o.male who presents today for surgical evaluation at the request of Dr. Silverio Lay.  [name] emergency department  Chief complaint / Reason for evaluation: Nausea vomiting and questionable hematemesis after cholecystectomy  23 year old male.  Seen by [facility] gastrology and Dr. Dossie Der to with our group.  Workup for upper abdominal pain.  Not classic biliary colic but somewhat suspicious with bloating radiating to the back and sometimes food triggers.  No history of heavy NSAIDs.  No heavy alcohol use.  No tobacco or smoking.  No diabetes.  Not on chronic narcotics.  Some constipation but nothing too severe.  Some family members had headache one sounds like sinusitis and upper respiratory tract infection but no diarrhea.  No travel.  Some early satiety and bloating.  Had been trying to use Zofran but with inadequate relief.  Saw gastroenterology who is underwhelmed for foregut etiology.  HIDA scan showed nonfunctioning gallbladder.  Referred to see Dr. Dossie Der who offered cholecystectomy which happened yesterday.  Intraoperative findings noted rom some acute on chronic cholecystitis.  To severe.  Because the patient was young and healthy, he was discharged home for outpatient surgery.   Patient notes he felt nauseated and vomited last night.  Felt worse and vomited up blood.  Soreness in the abdomen.  That concerned him.  Came to emergency department at [facility].  No shock hypertension.  No  peritonitis.  Mildly elevated LFTs.  CT scan reassuring of no free air perforation or fluid collection right upper quadrant.  No bowel obstruction or diverticulitis or appendicitis.  Because patient was having some persistent nausea they requested surgical evaluation..  Patient in room.  Wanting some pain medications.  Tolerating some clear liquids.  Has not had a bowel movement in few days.  Does note he does have some heartburn and reflux but nothing too severe.  Denies smoking.   Assessment  Kevin Schultz  23 y.o. male       Problem List:  Active Problems:   * No active hospital problems. *   Nausea vomiting questional hematemesis in the setting setting of cholecystectomy with issues of chronic nausea and vomiting  Plan:  IV fluid bolus 2 L.  Continue clear liquids.  I think he would benefit from staying on that for couple of days and then may be readvancing to a low-fat diet for the next week diet.  Nausea control.  Patient notes Zofran was not helping anymore.  He has been on that for several months.  Switch to metoclopramide and see if that works better since may be has some delayed gastric emptying issues.  He has early satiety belching and bloating in his stomach is full of food.  He is constipated.  Do not know why he would have that since it is usually more common in diabetics or patient with chronic severe constipation or on chronic narcotics; nonetheless that could be a possibility.   Agree with GI cocktail and PPI as he may have some gastritis, very Weiss tear from retching,  or other etiologies.  I wonder if he would benefit from staying on PPIs for the next 2-6 weeks.  No history of heavy NSAID use or binge alcohol.  No cirrhosis.  See what Gastroenterology thinks.  I do not know if he truly had hematemesis but he is not having it now.  No hematochezia nor melena.  His hemoglobin is not decreased.   Switch to different narcotic in case that was a trigger.  Patient not  certain that is the case  I think he is stable to go home from a surgery standpoint.  I do not think he has anything life-threatening at this time.  Call us in the office in the next day or so to see if things are improving.  Low threshold to involve gastroenterology to see if any other interventions are needed.  Should he fail p.o. trial he could benefit from medical evaluation and get GI reevaluation with endoscopy to make sure something else is not going on.  Hopefully things will calm down with more aggressive and acid and nausea control.  -monitor electrolytes & replace as needed  Keep K>4, Mg>2, Phos>3   I updated the patient's status to the patient and family  Dr Silverio Lay. With [facility] ED.  Recommendations were made.  Questions were answered.  They expressed understanding & appreciation.        I reviewed nursing notes, ED provider notes, last 24 h vitals and pain scores, last 48 h intake and output, last 24 h labs and trends, and last 24 h imaging results. I have reviewed this patient's available data, including medical history, events of note, test results, etc as part of my evaluation.  A significant portion of that time was spent in counseling.  Care during the described time interval was provided by me.  This care required moderate level of medical decision making.  06/24/2023  Ardeth Sportsman, MD, FACS, MASCRS Esophageal, Gastrointestinal & Colorectal Surgery Robotic and Minimally Invasive Surgery  [facility] [address] [place], [place] [address] [phone] Fax [phone] Main  CONTACT INFORMATION: Weekday (9AM-5PM): Call [facility] main office at [phone] Weeknight (5PM-9AM) or Weekend/Holiday: Check EPIC "Web Links" tab & use "AMION" (password " [email or web address]") for General Surgery [facility] coverage  Please, DO NOT use SecureChat  (it is not reliable communication to reach operating surgeons & will lead to a delay  in care).   Epic staff messaging available for outptient concerns needing 1-2 business day response.      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   Anxiety    GERD (gastroesophageal reflux disease)     Past Surgical History:  Procedure Laterality Date   CHOLECYSTECTOMY N/A 06/23/2023   Procedure: LAPAROSCOPIC CHOLECYSTECTOMY;  Surgeon: [name], [name], MD;  Location: [facility];  Service: General;  Laterality: N/A;   WISDOM TOOTH EXTRACTION      Social History   Socioeconomic History   Marital status: Single    Spouse name: Not on file   Number of children: 0   Years of education: Not on file   Highest education level: Not on file  Occupational History   Not on file  Tobacco Use   Smoking status: Former    Types: Cigarettes, Cigars   Smokeless tobacco: Never   Tobacco comments:    Black and mild's in high school  Vaping Use   Vaping status:  Never Used  Substance and Sexual Activity   Alcohol use: Not Currently    Comment: Social   Drug use: Yes    Types: Marijuana    Comment: Not currently   Sexual activity: Yes  Other Topics Concern   Not on file  Social History Narrative   Not on file   Social Drivers of Health   Financial Resource Strain: Not on file  Food Insecurity: Not on file  Transportation Needs: Not on file  Physical Activity: Not on file  Stress: Not on file  Social Connections: Not on file  Intimate Partner Violence: Not on file    Family History  Problem Relation Age of Onset   Alzheimer's disease Maternal Grandmother    Cancer Maternal Grandfather        type unknown   Alzheimer's disease Paternal Grandmother    Diabetes Maternal Uncle     Current Facility-Administered Medications  Medication Dose Route Frequency Provider Last Rate Last Admin   diphenhydrAMINE (BENADRYL) injection 25 mg  25 mg Intravenous Once Charlynne Pander, MD       metoCLOPramide (REGLAN) injection 10 mg   10 mg Intravenous Once Charlynne Pander, MD       morphine (PF) 4 MG/ML injection 4 mg  4 mg Intravenous Once Charlynne Pander, MD       Current Outpatient Medications  Medication Sig Dispense Refill   diazepam (VALIUM) 5 MG tablet Take 5 mg by mouth every 6 (six) hours as needed for anxiety.     ibuprofen (ADVIL) 800 MG tablet Take 800 mg by mouth 3 (three) times daily as needed.     oxyCODONE-acetaminophen (PERCOCET) 5-325 MG tablet Take 1 tablet by mouth every 4 (four) hours as needed for severe pain (pain score 7-10). 20 tablet 0     No Known Allergies  ROS:   All other systems reviewed & are negative except per HPI or as noted below: Constitutional:  No fevers, chills, sweats.  Weight stable Eyes:  No vision changes, No discharge HENT:  No sore throats, nasal drainage Lymph: No neck swelling, No bruising easily Pulmonary:  No cough, productive sputum CV: No orthopnea, PND  Patient walks 20 minutes without difficulty.  No exertional chest/neck/shoulder/arm pain.  GI: No personal nor family history of GI/colon cancer, inflammatory bowel disease, irritable bowel syndrome, allergy such as Celiac Sprue, dietary/dairy problems, colitis, ulcers nor gastritis.  No recent sick GI contacts/gastroenteritis.  No travel outside the country.  No changes in diet.  Renal: No UTIs, No hematuria Genital:  No drainage, bleeding, masses Musculoskeletal: No severe joint pain.  Good ROM major joints Skin:  No sores or lesions Heme/Lymph:  No easy bleeding.  No swollen lymph nodes   BP (!) 145/86 (BP Location: Right Arm)   Pulse (!) 102   Temp 98.2 F (36.8 C) (Oral)   Resp 18   SpO2 97%   Physical Exam:  Constitutional: Not cachectic.  Hygeine adequate.  Vitals signs as above.  Looks tired but he is not toxic or sickly.  No acute distress. Eyes: Pupils reactive, normal extraocular movements. Sclera nonicteric Neuro: CN II-XII intact.  No major focal sensory defects.  No major motor  deficits. Lymph: No head/neck/groin lymphadenopathy Psych:  No severe agitation.  No severe anxiety.  Judgment & insight Adequate, Oriented x4, HENT: Normocephalic, Mucus membranes moist.  No thrush.   Neck: Supple, No tracheal deviation.  No obvious thyromegaly Chest: No pain to chest wall compression.  Good respiratory excursion.  No audible wheezing CV:  Pulses intact.  regular rhythm.  No major extremity edema  Abdomen:  Obese Hernia: Not present. Diastasis recti: Not present. Soft.   Nondistended.  Incisions with Dermabond clean dry and intact with minimal ecchymosis.  Mild sensitivity there only.  No guarding or peritonitis..  No hepatomegaly.  No splenomegaly  Ext: No obvious deformity or contracture.  Edema: Not present.  No cyanosis Skin: No major subcutaneous nodules.  Warm and dry Musculoskeletal: Severe joint rigidity not present.  No obvious clubbing.  No digital petechiae.     Results:   Labs: Results for orders placed or performed during the hospital encounter of 06/24/23 (from the past 48 hours)  CBC with Differential     Status: Abnormal   Collection Time: 06/24/23  1:38 PM  Result Value Ref Range   WBC 18.8 (H) 4.0 - 10.5 K/uL   RBC 4.48 4.22 - 5.81 MIL/uL   Hemoglobin 14.7 13.0 - 17.0 g/dL   HCT 16.1 09.6 - 04.5 %   MCV 93.3 80.0 - 100.0 fL   MCH 32.8 26.0 - 34.0 pg   MCHC 35.2 30.0 - 36.0 g/dL   RDW 40.9 81.1 - 91.4 %   Platelets 390 150 - 400 K/uL   nRBC 0.0 0.0 - 0.2 %   Neutrophils Relative % 77 %   Neutro Abs 14.3 (H) 1.7 - 7.7 K/uL   Lymphocytes Relative 14 %   Lymphs Abs 2.6 0.7 - 4.0 K/uL   Monocytes Relative 9 %   Monocytes Absolute 1.7 (H) 0.1 - 1.0 K/uL   Eosinophils Relative 0 %   Eosinophils Absolute 0.0 0.0 - 0.5 K/uL   Basophils Relative 0 %   Basophils Absolute 0.0 0.0 - 0.1 K/uL   Immature Granulocytes 0 %   Abs Immature Granulocytes 0.06 0.00 - 0.07 K/uL    Comment: Performed at [facility], [address]  [place], [place] [address]  Comprehensive metabolic panel     Status: Abnormal   Collection Time: 06/24/23  1:38 PM  Result Value Ref Range   Sodium 137 135 - 145 mmol/L   Potassium 3.3 (L) 3.5 - 5.1 mmol/L   Chloride 100 98 - 111 mmol/L   CO2 25 22 - 32 mmol/L   Glucose, Bld 108 (H) 70 - 99 mg/dL    Comment: Glucose reference range applies only to samples taken after fasting for at least 8 hours.   BUN 20 6 - 20 mg/dL   Creatinine, Ser 6.21 0.61 - 1.24 mg/dL   Calcium 9.5 8.9 - 30.8 mg/dL   Total Protein 8.3 (H) 6.5 - 8.1 g/dL   Albumin 4.8 3.5 - 5.0 g/dL   AST 35 15 - 41 U/L   ALT 29 0 - 44 U/L   Alkaline Phosphatase 71 38 - 126 U/L   Total Bilirubin 1.3 (H) 0.0 - 1.2 mg/dL   GFR, Estimated >65 >78 mL/min    Comment: (NOTE) Calculated using the CKD-EPI Creatinine Equation (2021)    Anion gap 12 5 - 15    Comment: Performed at [facility], [address], [place], [place] [address]  Lipase, blood     Status: None   Collection Time: 06/24/23  1:38 PM  Result Value Ref Range   Lipase 26 11 - 51 U/L    Comment: Performed at [facility], [address] [place], [place] [address]  Type and screen [facility]     Status: None   Collection Time: 06/24/23  1:38 PM  Result Value Ref Range   ABO/RH(D) O POS    Antibody Screen NEG    Sample Expiration      06/27/2023,2359 Performed at [facility], [address], [place], [place] [address]   Protime-INR     Status: None   Collection Time: 06/24/23  1:38 PM  Result Value Ref Range   Prothrombin Time 14.1 11.4 - 15.2 seconds   INR 1.1 0.8 - 1.2    Comment: (NOTE) INR goal varies based on device and disease states. Performed at [facility], [address], [place], [place] [address]     Imaging / Studies: CT CHEST ABDOMEN PELVIS W CONTRAST Result Date: 06/24/2023 CLINICAL DATA:  Postoperative pain. Vomiting. Cholecystectomy ester day  peer EXAM: CT CHEST, ABDOMEN, AND PELVIS WITH CONTRAST TECHNIQUE: Multidetector CT imaging of the chest, abdomen and pelvis was performed following the standard protocol during bolus administration of intravenous contrast. RADIATION DOSE REDUCTION: This exam was performed according to the departmental dose-optimization program which includes automated exposure control, adjustment of the mA and/or kV according to patient size and/or use of iterative reconstruction technique. CONTRAST:  OMNIPAQUE IOHEXOL 300 MG/ML  SOLN COMPARISON:  CT 04/21/2023 FINDINGS: CT CHEST FINDINGS Cardiovascular: The heart is normal in size. No pericardial effusion. Thoracic aorta is normal caliber without acute findings. There is no central pulmonary embolus. Mediastinum/Nodes: No mediastinal or hilar lymphadenopathy. Small amount of fluid in the esophagus which is patulous. Minimal distal esophageal wall thickening. No thyroid nodule. Lungs/Pleura: No focal airspace disease, pleural effusion, or pneumothorax. Trachea and central airways are clear. Musculoskeletal: There are no acute or suspicious osseous abnormalities. CT ABDOMEN PELVIS FINDINGS Hepatobiliary: Recent cholecystectomy. Expected stranding in the cholecystectomy bed. No focal fluid collection. No biliary dilatation. Unremarkable appearance of the liver. Pancreas: Unremarkable. No pancreatic ductal dilatation or surrounding inflammatory changes. Spleen: Normal in size without focal abnormality. Adrenals/Urinary Tract: No adrenal nodule. No hydronephrosis, renal calculi or renal inflammation. Multiple bilateral renal cysts. No further follow-up imaging is recommended. Unremarkable urinary bladder. Stomach/Bowel: Heterogeneous material in the stomach. Minimal wall thickening about the greater curvature of the stomach. Normal small bowel without obstruction or inflammation. Appendix is not well seen. Fluid within the proximal colon. Small volume of formed stool in the distal  colon. There is no colonic inflammatory change. Vascular/Lymphatic: No acute vascular findings. The portal vein is patent. Normal caliber abdominal aorta. No adenopathy. Reproductive: Prostate is unremarkable. Other: Small volume of free air in the upper abdomen is normal 1 day post intra-abdominal surgery. There is trace free fluid in the right pericolic gutter and in the pelvis. No organized collection. Postsurgical change of the anterior abdominal wall at laparoscopic port sites. Musculoskeletal: There are no acute or suspicious osseous abnormalities. IMPRESSION: 1. Recent cholecystectomy with expected stranding in the cholecystectomy bed. No focal fluid collection. 2. Small volume of free air in the upper abdomen is normal post recent intra-abdominal surgery. Trace free fluid in the right pericolic gutter and in the pelvis. 3. Small amount of fluid in the esophagus which is patulous. Minimal distal esophageal wall thickening may be due to reflux. 4. Heterogeneous material in the stomach with minimal wall thickening about the greater curvature. Electronically Signed   By: Narda Rutherford M.D.   On: 06/24/2023 15:42    Medications / Allergies: per chart  Antibiotics: Anti-infectives (From admission, onward)  None         Note: Portions of this report may have been transcribed using voice recognition software. Every effort was made to ensure accuracy; however, inadvertent computerized transcription errors may be present.   Any transcriptional errors that result from this process are unintentional.    Ardeth Sportsman, MD, FACS, MASCRS Esophageal, Gastrointestinal & Colorectal Surgery Robotic and Minimally Invasive Surgery  [facility] [address] [place], [place] [address] [phone] Fax [phone] Main  CONTACT INFORMATION: Weekday (9AM-5PM): Call [facility] main office at [phone] Weeknight (5PM-9AM) or  Weekend/Holiday: Check EPIC "Web Links" tab & use "AMION" (password " [email or web address]") for General Surgery [facility] coverage  Please, DO NOT use SecureChat  (it is not reliable communication to reach operating surgeons & will lead to a delay in care).   Epic staff messaging available for outptient concerns needing 1-2 business day response.       06/24/2023  6:03 PM

## 2023-06-24 NOTE — ED Provider Notes (Signed)
 [place] EMERGENCY DEPARTMENT AT [facility] Provider Note   CSN: [id number] Arrival date & time: 06/24/23  1243     History  Chief Complaint  Patient presents with   Hematemesis    Kevin Schultz is a 23 y.o. male.  Patient is a 23 year old male with recent cholecystectomy yesterday, otherwise no significant past medical history presenting to the emergency department with hematemesis.  The patient states that he was discharged yesterday afternoon after his gallbladder was removed.  He states around 7 PM he started to develop nausea and vomiting.  He states that it initially looked like his food and has progressed to completely looking like blood.  He states that he filled the toilet as well as a cup with bloody emesis.  He states that he still is feeling sore with some abdominal pain from from the surgery and is having some right shoulder pain and mid back pain since the surgery.  He denies any blood thinner use or shortness of breath.  He denies any significant alcohol use.  The history is provided by the patient.       Home Medications Prior to Admission medications   Medication Sig Start Date End Date Taking? Authorizing Provider  diazepam (VALIUM) 5 MG tablet Take 5 mg by mouth every 6 (six) hours as needed for anxiety.    [provider]  ibuprofen (ADVIL) 800 MG tablet Take 800 mg by mouth 3 (three) times daily as needed. 04/13/23   [provider]  oxyCODONE-acetaminophen (PERCOCET) 5-325 MG tablet Take 1 tablet by mouth every 4 (four) hours as needed for severe pain (pain score 7-10). 06/23/23 06/22/24  [name], [name], MD      Allergies    Patient has no known allergies.    Review of Systems   Review of Systems  Physical Exam Updated Vital Signs BP (!) 145/86 (BP Location: Right Arm)   Pulse (!) 102   Temp 98.2 F (36.8 C) (Oral)   Resp 18   SpO2 97%  Physical Exam Vitals and nursing note reviewed.  Constitutional:       General: He is not in acute distress.    Appearance: Normal appearance. He is ill-appearing.  HENT:     Head: Normocephalic and atraumatic.     Nose: Nose normal.     Mouth/Throat:     Mouth: Mucous membranes are moist.     Pharynx: Oropharynx is clear.  Eyes:     Extraocular Movements: Extraocular movements intact.     Conjunctiva/sclera: Conjunctivae normal.  Cardiovascular:     Rate and Rhythm: Regular rhythm. Tachycardia present.     Heart sounds: Normal heart sounds.  Pulmonary:     Effort: Pulmonary effort is normal.     Breath sounds: Normal breath sounds.  Abdominal:     General: Abdomen is flat.     Palpations: Abdomen is soft.     Tenderness: There is abdominal tenderness (diffuse). There is no guarding.     Comments: Contusion of umbilicus, otherwise surgical scars appear well healing without surrounding erythema, warmth or drainage  Musculoskeletal:        General: Normal range of motion.  Skin:    General: Skin is warm and dry.  Neurological:     General: No focal deficit present.     Mental Status: He is alert and oriented to person, place, and time.  Psychiatric:        Mood and Affect: Mood normal.  Behavior: Behavior normal.     ED Results / Procedures / Treatments   Labs (all labs ordered are listed, but only abnormal results are displayed) Labs Reviewed  CBC WITH DIFFERENTIAL/PLATELET - Abnormal; Notable for the following components:      Result Value   WBC 18.8 (*)    Neutro Abs 14.3 (*)    Monocytes Absolute 1.7 (*)    All other components within normal limits  COMPREHENSIVE METABOLIC PANEL - Abnormal; Notable for the following components:   Potassium 3.3 (*)    Glucose, Bld 108 (*)    Total Protein 8.3 (*)    Total Bilirubin 1.3 (*)    All other components within normal limits  LIPASE, BLOOD  PROTIME-INR  TYPE AND SCREEN  ABO/RH    EKG None  Radiology No results found.  Procedures Procedures    Medications Ordered in  ED Medications  lactated ringers bolus 1,000 mL (1,000 mLs Intravenous New Bag/Given 06/24/23 1343)  pantoprazole (PROTONIX) injection 40 mg (40 mg Intravenous Given 06/24/23 1344)  ondansetron (ZOFRAN) injection 4 mg (4 mg Intravenous Given 06/24/23 1346)  morphine (PF) 4 MG/ML injection 4 mg (4 mg Intravenous Given 06/24/23 1408)  iohexol (OMNIPAQUE) 300 MG/ML solution 100 mL (100 mLs Intravenous Contrast Given 06/24/23 1452)    ED Course/ Medical Decision Making/ A&P Clinical Course as of 06/24/23 1527  Wed Jun 24, 2023  1433 Mild hypokalemia and mildly elevated bili, Hgb stable, leukocytosis may be reactionary from recent procedure. CT is pending. [VK]  1526 Patient signed out to Dr. Silverio Lay in stable condition pending CT read. [VK]    Clinical Course User Index [VK] Rexford Maus, DO                                 Medical Decision Making This patient presents to the ED with chief complaint(s) of hematemesis with pertinent past medical history of recent cholecystectomy which further complicates the presenting complaint. The complaint involves an extensive differential diagnosis and also carries with it a high risk of complications and morbidity.    The differential diagnosis includes gastritis, Mallory-Weiss tear, esophageal rupture, anemia, coagulopathy, dehydration, electrolyte abnormality, postop complication  Additional history obtained: Additional history obtained from N/A Records reviewed previous admission documents  ED Course and Reassessment: On patient's arrival he is mildly tachycardic and otherwise hemodynamically stable in no acute distress.  Did have significant hematemesis prior to arrival.  Will do labs including type and screen and coags performed as well as CT chest abdomen pelvis to evaluate for any postop complication or signs of esophageal rupture or etiology of his bleed.  Will be started on fluids, Protonix and is given Zofran and morphine for symptomatic  management and will be closely reassessed.  Independent labs interpretation:  The following labs were independently interpreted: leukocytosis, mild hypokalemia, otherwise within normal range  Independent visualization of imaging: - Pending    Amount and/or Complexity of Data Reviewed Labs: ordered. Radiology: ordered.  Risk Prescription drug management.          Final Clinical Impression(s) / ED Diagnoses Final diagnoses:  None    Rx / DC Orders ED Discharge Orders     None         Rexford Maus, DO 06/24/23 1527

## 2023-06-24 NOTE — ED Notes (Signed)
 Per EDP patient can take oxy pain meds PO from home

## 2023-06-25 ENCOUNTER — Telehealth: Payer: Self-pay

## 2023-06-25 LAB — SURGICAL PATHOLOGY

## 2023-06-25 NOTE — Telephone Encounter (Signed)
 Left message for patient to call back

## 2023-06-25 NOTE — Telephone Encounter (Signed)
 Ottie Glazier, MD  P [facility] B Triage POD B -  Can you please review schedules to see if there are any available clinic appointments for an APP to see Kevin Schultz tomorrow or next week in follow-up of his ER visit?  I previously saw him in the office for biliary colic.  He underwent cholecystectomy and presented to the ED 1 day postop with nausea, vomiting and hematemesis.  Hemoglobin did decline it is suspected that he has a Mallory-Weiss tear or esophagitis.  It would be appropriate for him to be seen within the next week and to have a repeat H&H checked.  I will be in the hospital next week and do not have any clinic availability.  Harriett Sine

## 2023-06-26 NOTE — Telephone Encounter (Signed)
 Left message for patient to call back

## 2023-07-02 NOTE — Telephone Encounter (Signed)
 Scheduled OV with patient for 07/14/23 at 8:40 am with [place], [place]. Sooner dates were offered to patient, however he is unable to make it d/t transportation. Morning times do work better for him. Advised him I'd reach out if anything sooner for morning time becomes available.

## 2023-07-04 ENCOUNTER — Other Ambulatory Visit: Payer: Self-pay

## 2023-07-04 ENCOUNTER — Emergency Department (HOSPITAL_COMMUNITY)
Admission: EM | Admit: 2023-07-04 | Discharge: 2023-07-05 | Disposition: A | Discharge status: Home / Self Care | Attending: Emergency Medicine | Admitting: Emergency Medicine

## 2023-07-04 ENCOUNTER — Encounter (HOSPITAL_COMMUNITY): Payer: Self-pay | Admitting: Emergency Medicine

## 2023-07-04 DIAGNOSIS — R000 Tachycardia, unspecified: Secondary | ICD-10-CM | POA: Diagnosis not present

## 2023-07-04 DIAGNOSIS — B349 Viral infection, unspecified: Secondary | ICD-10-CM | POA: Diagnosis not present

## 2023-07-04 DIAGNOSIS — R509 Fever, unspecified: Secondary | ICD-10-CM | POA: Diagnosis present

## 2023-07-04 MED ORDER — SODIUM CHLORIDE 0.9 % IV BOLUS: 1000.0000 mL | Freq: Once | INTRAVENOUS | Status: DC

## 2023-07-04 MED ORDER — ACETAMINOPHEN 325 MG PO TABS
650.0000 mg | ORAL_TABLET | Freq: Once | ORAL | Status: AC
  Administered 2023-07-04: 650 mg via ORAL
  Filled 2023-07-04: qty 2

## 2023-07-04 NOTE — ED Provider Notes (Incomplete)
  EMERGENCY DEPARTMENT AT [facility] Provider Note   CSN: [id number] Arrival date & time: 07/04/23  2315     History {Add pertinent medical, surgical, social history, OB history to HPI:1} Chief Complaint  Patient presents with  . Generalized Body Aches    Kevin Schultz is a 23 y.o. 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  Patient states that he has multiple for members at home that the flu.  Patient denies chest pain shortness of breath, productive cough, abdominal pain nausea vomiting dysuria constipation diarrhea.  Patient has been taking NyQuil which helps somewhat.  Patient has been seen previously after his surgery and had a normal CT scan done.  Patient is eating and drinking without issue.    Home Medications Prior to Admission medications   Medication Sig Start Date End Date Taking? Authorizing Provider  diazepam (VALIUM) 5 MG tablet Take 5 mg by mouth every 6 (six) hours as needed for anxiety.    [provider]  esomeprazole (NEXIUM) 40 MG capsule Take 1 capsule (40 mg total) by mouth daily. 06/24/23   Charlynne Pander, MD  HYDROcodone-acetaminophen (NORCO/VICODIN) 5-325 MG tablet Take 1-2 tablets by mouth every 6 (six) hours as needed for moderate pain (pain score 4-6) or severe pain (pain score 7-10). 06/24/23   Karie Soda, MD  metoCLOPramide (REGLAN) 10 MG tablet Take 1 tablet (10 mg total) by mouth every 6 (six) hours as needed for nausea (nausea / vomiting). 06/24/23   Karie Soda, MD      Allergies    Nsaids    Review of Systems   Review of Systems  Physical Exam Updated Vital Signs BP (!) 144/84   Pulse (!) 131   Temp 100.3 F (37.9 C) (Oral)   Resp 20   SpO2 97%  Physical Exam Vitals reviewed.  Constitutional:      General: He is not in acute distress. HENT:     Head: Normocephalic and atraumatic.   Eyes:     Extraocular Movements: Extraocular movements intact.     Conjunctiva/sclera: Conjunctivae normal.     Pupils: Pupils are equal, round, and reactive to light.  Cardiovascular:     Rate and Rhythm: Regular rhythm. Tachycardia present.     Pulses: Normal pulses.     Heart sounds: Normal heart sounds.     Comments: 2+ bilateral radial/dorsalis pedis pulses with regular rate Pulmonary:     Effort: Pulmonary effort is normal. No respiratory distress.     Breath sounds: Normal breath sounds.  Abdominal:     Palpations: Abdomen is soft.     Tenderness: There is no abdominal tenderness. There is no guarding or rebound.  Musculoskeletal:        General: Normal range of motion.     Cervical back: Normal range of motion and neck supple.     Comments: 5 out of 5 bilateral grip/leg extension strength  Skin:    General: Skin is warm and dry.     Capillary Refill: Capillary refill takes less than 2 seconds.  Neurological:     General: No focal deficit present.     Mental Status: He is alert and oriented to person, place, and time.     Comments: Sensation intact in all 4 limbs  Psychiatric:        Mood and Affect: Mood normal.     ED Results / Procedures / Treatments  Labs (all labs ordered are listed, but only abnormal results are displayed) Labs Reviewed  RESP PANEL BY RT-PCR (RSV, FLU A&B, COVID)  RVPGX2    EKG None  Radiology No results found.  Procedures Procedures  {Document cardiac monitor, telemetry assessment procedure when appropriate:1}  Medications Ordered in ED Medications  sodium chloride 0.9 % bolus 1,000 mL (has no administration in time range)  acetaminophen (TYLENOL) tablet 650 mg (650 mg Oral Given 07/04/23 2332)    ED Course/ Medical Decision Making/ A&P   {   Click here for ABCD2, HEART and other calculatorsREFRESH Note before signing :1}                              Medical Decision Making Risk OTC drugs.   Kevin Schultz 23 y.o.  presented today for URI like symptoms. Working DDx that I considered at this time includes, but not limited to, viral illness, pharyngitis, mono, sinusitis, electrolyte abnormality, AOM, pneumonia.  R/o DDx: ***: these diagnoses are not consistent with patient's history, presentation, physical exam, labs/imaging findings.  Review of prior external notes: 06/26/2023 surgery  Unique Tests and My Independent Interpretation:  Respiratory Panel: ***  Social Determinants of Health: none  Discussion with Independent Historian:  Friend  Discussion of Management of Tests: {historian:29369}  Risk: {Risk:29370}  Risk Stratification Score: None  Plan: On exam patient was no acute distress but was borderline febrile at 100.3 F along with tachycardia at 131.  Patient given Tylenol.  Rest patient's exam was unremarkable.  Patient says he is sick contacts at home with the flu which I suspect is causing his symptoms.  Patient had a CT scan done on 312 after he had a surgery to rule out postop complication and he is not currently endorsing any abdominal pain nor has peritoneal signs at this time that would require further workup.  Will give him fluids and see if this helps with this tachycardia and get a respiratory panel.  Patient was p.o. challenged and is safe to be discharged at this time follow-up outpatient.  Patient is not requiring any oxygen and has not required any oxygen at any point during the ED stay.  I recommended patient use Tylenol every 6 hours needed for pain remain hydrated.  Will prescribe Zofran in case patient becomes nauseous.  Recommend patient follows up with primary care provider.  At this time patient is safe to be discharged.  Patient was given return precautions. Patient stable for discharge at this time.  Patient verbalized understanding of plan.  This chart was dictated using voice recognition software.  Despite best efforts to proofread,  errors can occur which can change  the documentation meaning.   {Document critical care time when appropriate:1} {Document review of labs and clinical decision tools ie heart score, Chads2Vasc2 etc:1}  {Document your independent review of radiology images, and any outside records:1} {Document your discussion with family members, caretakers, and with consultants:1} {Document social determinants of health affecting pt's care:1} {Document your decision making why or why not admission, treatments were needed:1} Final Clinical Impression(s) / ED Diagnoses Final diagnoses:  None    Rx / DC Orders ED Discharge Orders     None

## 2023-07-04 NOTE — ED Triage Notes (Signed)
 Pt reports generalized body aches, chills and headache x 2 days. Reports taking home nyquil w/ no relief.

## 2023-07-04 NOTE — ED Provider Notes (Signed)
 [place] EMERGENCY DEPARTMENT AT [facility] Provider Note   CSN: [id number] Arrival date & time: 07/04/23  2315     History  Chief Complaint  Patient presents with   Generalized Body Aches    Kevin Schultz is a 23 y.o. 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  Patient states that he has multiple for members at home that the flu.  Patient denies chest pain shortness of breath, productive cough, abdominal pain nausea vomiting dysuria constipation diarrhea.  Patient has been taking NyQuil which helps somewhat.  Patient has been seen previously after his surgery and had a normal CT scan done.  Patient is eating and drinking without issue.    Home Medications Prior to Admission medications   Medication Sig Start Date End Date Taking? Authorizing Provider  ondansetron (ZOFRAN) 4 MG tablet Take 1 tablet (4 mg total) by mouth every 6 (six) hours. 07/05/23  Yes Mavryk Pino, Beverly Gust, PA-C  diazepam (VALIUM) 5 MG tablet Take 5 mg by mouth every 6 (six) hours as needed for anxiety.    [provider]  esomeprazole (NEXIUM) 40 MG capsule Take 1 capsule (40 mg total) by mouth daily. 06/24/23   Charlynne Pander, MD  HYDROcodone-acetaminophen (NORCO/VICODIN) 5-325 MG tablet Take 1-2 tablets by mouth every 6 (six) hours as needed for moderate pain (pain score 4-6) or severe pain (pain score 7-10). 06/24/23   Karie Soda, MD  metoCLOPramide (REGLAN) 10 MG tablet Take 1 tablet (10 mg total) by mouth every 6 (six) hours as needed for nausea (nausea / vomiting). 06/24/23   Karie Soda, MD      Allergies    Nsaids    Review of Systems   Review of Systems  Physical Exam Updated Vital Signs BP 127/72   Pulse (!) 102   Temp 98.4 F (36.9 C) (Oral)   Resp 16   SpO2 98%  Physical Exam Vitals reviewed.  Constitutional:      General: He is not in acute  distress. HENT:     Head: Normocephalic and atraumatic.  Eyes:     Extraocular Movements: Extraocular movements intact.     Conjunctiva/sclera: Conjunctivae normal.     Pupils: Pupils are equal, round, and reactive to light.  Cardiovascular:     Rate and Rhythm: Regular rhythm. Tachycardia present.     Pulses: Normal pulses.     Heart sounds: Normal heart sounds.     Comments: 2+ bilateral radial/dorsalis pedis pulses with regular rate Pulmonary:     Effort: Pulmonary effort is normal. No respiratory distress.     Breath sounds: Normal breath sounds.  Abdominal:     Palpations: Abdomen is soft.     Tenderness: There is no abdominal tenderness. There is no guarding or rebound.  Musculoskeletal:        General: Normal range of motion.     Cervical back: Normal range of motion and neck supple.     Comments: 5 out of 5 bilateral grip/leg extension strength  Skin:    General: Skin is warm and dry.     Capillary Refill: Capillary refill takes less than 2 seconds.  Neurological:     General: No focal deficit present.     Mental Status: He is alert and oriented to person, place, and time.     Comments: Sensation intact in all 4 limbs  Psychiatric:  Mood and Affect: Mood normal.     ED Results / Procedures / Treatments   Labs (all labs ordered are listed, but only abnormal results are displayed) Labs Reviewed  RESP PANEL BY RT-PCR (RSV, FLU A&B, COVID)  RVPGX2    EKG None  Radiology No results found.  Procedures Procedures    Medications Ordered in ED Medications  sodium chloride 0.9 % bolus 1,000 mL (has no administration in time range)  acetaminophen (TYLENOL) tablet 650 mg (650 mg Oral Given 07/04/23 2332)    ED Course/ Medical Decision Making/ A&P                                 Medical Decision Making Risk OTC drugs. Prescription drug management.   [name] 23 y.o. presented today for URI like symptoms. Working DDx that I considered at  this time includes, but not limited to, viral illness, pharyngitis, mono, sinusitis, electrolyte abnormality, AOM, pneumonia.  R/o DDx: pharyngitis, mono, sinusitis, electrolyte abnormality, AOM, pneumonia: these diagnoses are not consistent with patient's history, presentation, physical exam, labs/imaging findings.  Review of prior external notes: 06/26/2023 surgery  Unique Tests and My Independent Interpretation:  Respiratory Panel: Negative  Social Determinants of Health: none  Discussion with Independent Historian:  Friend  Discussion of Management of Tests: None  Risk: Medium: prescription drug management  Risk Stratification Score: None  Plan: On exam patient was no acute distress but was borderline febrile at 100.3 F along with tachycardia at 131.  Patient given Tylenol.  Rest patient's exam was unremarkable.  Patient says he is sick contacts at home with the flu which I suspect is causing his symptoms.  Patient had a CT scan done on 312 after he had a surgery to rule out postop complication and he is not currently endorsing any abdominal pain nor has peritoneal signs at this time that would require further workup.  Will give him fluids and see if this helps with this tachycardia and get a respiratory panel.  Patient was p.o. challenged and is safe to be discharged at this time follow-up outpatient.  Patient is not requiring any oxygen and has not required any oxygen at any point during the ED stay.  I recommended patient use Tylenol every 6 hours needed for pain remain hydrated.  Will prescribe Zofran in case patient becomes nauseous.  Recommend patient follows up with primary care provider.  At this time patient is safe to be discharged.  Patient improved here with Tylenol when I went to go discharge him patient stated he was okay being discharged with NSAID he started having mild abdominal discomfort.  Patient's been able to tolerate fluids p.o. here without difficulty and has had  no episodes of emesis.  Patient had no abdominal tenderness nor signs of infection along surgical sites and does not have any peritoneal signs here on exam.  Patient is also had a negative CT scan done earlier this month and has been following up with his general surgeon without any complications.  I discussed with the patient possibility by doing labs and imaging however patient declined and states that he is okay to go home which I think is reasonable as patient most likely is viral illness in the setting of having sick contacts at home and appearing healthy.  I strongly encourage patient stay hydrated along with the plan listed above and recommend he follows up with primary care provider for close  follow-up.  Will give work note along with Zofran.  Patient was given return precautions. Patient stable for discharge at this time.  Patient verbalized understanding of plan.  This chart was dictated using voice recognition software.  Despite best efforts to proofread,  errors can occur which can change the documentation meaning.         Final Clinical Impression(s) / ED Diagnoses Final diagnoses:  Viral illness    Rx / DC Orders ED Discharge Orders          Ordered    ondansetron (ZOFRAN) 4 MG tablet  Every 6 hours        07/05/23 0114              Remi Deter 07/05/23 0117    Palumbo, April, MD 07/05/23 [phone]

## 2023-07-05 LAB — RESP PANEL BY RT-PCR (RSV, FLU A&B, COVID)  RVPGX2
Influenza A by PCR: NEGATIVE
Influenza B by PCR: NEGATIVE
Resp Syncytial Virus by PCR: NEGATIVE
SARS Coronavirus 2 by RT PCR: NEGATIVE

## 2023-07-05 MED ORDER — ONDANSETRON HCL 4 MG PO TABS: 4.0000 mg | ORAL_TABLET | Freq: Four times a day (QID) | ORAL | 0 refills | Status: AC

## 2023-07-05 NOTE — Discharge Instructions (Signed)
 Please follow-up with your primary care provider regards recent ER visit. Today your labs and imaging are reassuring most likely have a viral illness. Please use Tylenol or ibuprofen every 6 hours as needed for pain. I have prescribed Zofran in case you become nauseous. Please remain hydrated and eat food as tolerated. If symptoms change or worsen please return to the ER.

## 2023-07-13 NOTE — Progress Notes (Unsigned)
 07/14/2023 Kevin Schultz [id number] 2001/03/01  Referring provider: No ref. provider found Primary GI doctor: Dr. Doy Hutching  ASSESSMENT AND PLAN:   GERD/epigastric pain Took nexium for 30 days but stopped recently No tobacco use, no ETOH, no NSAIDS Denies allergies or asthma HIDA positive status post laparoscopic cholecystectomy 06/23/2023 Postoperative CT with patulous esophagus and changes of reflux Denies GERD, nausea, vomiting.No chest pain or AB pain No fever, chills. No melena, no hematochezia Continue on nexium 40 mg daily, symptoms have improved at this point however with his abnormal CT would suggest EGD at [facility] with Dr. Doy Hutching to rule out EOE esophagitis, H pylori, achalasia May need to consider manometry versus barium swallow  Mild loose stools s/p cholecystectomy Discussed pathology, low fat foods If not better 6 months can consider bile acid sequestrant  Fatty liver seen on AB [place] - need LFTs and CBC monitored every 6 months, revaluation every 2-3 years.  --Continue to work on risk factor modification including diet exercise and control of risk factors including blood sugars.  I have reviewed the clinic note as outlined by [name], PA and agree with the assessment, plan and medical decision making.[name] underwent cholecystectomy for nonfunctioning gallbladder with improvement in symptoms.  Postoperatively he experienced abdominal pain with associated nausea and vomiting prompting ER visit.  At the time of that evaluation a CT scan was performed that showed minimal distal esophageal thickening as well as a fluid-filled patulous esophagus that could represent reflux.  He denies any upper GI symptoms at the time of his current visit.  Will proceed with EGD for further evaluation.  Agree with dietary modification for management of loose stools status post cholecystectomy.  LFTs will be monitored in conjunction with CBC every 6 months.  [name],  MD   Patient Care Team: Pcp, No as PCP - General [name], [name], MD as Consulting Physician (Surgery) [name], [name], MD as Consulting Physician (Gastroenterology)  HISTORY OF PRESENT ILLNESS: Discussed the use of AI scribe software for clinical note transcription with the patient, who gave verbal consent to proceed.  History of Present Illness   Kevin Schultz is a 23 year old male who presents for follow-up after gallbladder removal and evaluation of esophageal symptoms.  He underwent a HIDA scan which revealed a non-functioning gallbladder, leading to its removal on March 11th. Post-surgery, previous symptoms have resolved and he is feeling better overall. Following the gallbladder removal, a CT scan of the chest, abdomen, and pelvis was performed on March 12th due to post-operative discomfort. The scan showed esophageal thickening and a patulous esophagus.  Despite the findings of esophageal thickening and patulous esophagus, he has no reflux, swallowing difficulties, heartburn, nausea, vomiting, chest discomfort, or abdominal pain. No fever or chills. He was prescribed Nexium (esomeprazole) for reflux, which he took for 30 days, with the last dose taken two days ago. This medication has alleviated his symptoms. He is not currently using reflux medication.  No issues with eating or drinking, such as food getting stuck or slow passage. No dark or bloody stools. No shortness of breath or chest discomfort. No rash around his eyes.  He denies smoking, alcohol use, and the use of NSAIDs like Aleve or ibuprofen. No allergies or asthma. His bowel habits are generally normal, with occasional mild changes post-surgery, attributed to the recent gallbladder removal. No significant diarrhea or constipation.        He  reports that he has quit smoking. His smoking use  included cigarettes and cigars. He has never used smokeless tobacco. He reports that he does not currently use alcohol.  He reports current drug use. Drug: Marijuana.  RELEVANT GI HISTORY, IMAGING AND LABS: Results   RADIOLOGY CT chest, abdomen, pelvis: Esophageal thickening, patulous esophagus, minimal distal esophageal wall thickening (06/24/2023)  DIAGNOSTIC HIDA scan: Gallbladder non-functioning     04/21/2023 CTAP: Minimal to mild gallbladder wall thickening without cholelithiasis; normal liver 04/21/2023 RUQ [place]: Dilated gallbladder with wall thickening and wall edema without stones; mild echogenic hepatic parenchyma -correlate for fatty liver infiltration HIDA 05/13/2023 positive with cystic duct obstruction due to cholecystitis 06/24/2023 CT chest abdomen pelvis with contrast for postoperative pain for recent cholecystectomy no fluid collection, small volume free air upper abdomen normal post recent intra-abdominal surgery small amount of fluid in the esophagus patulous with distal esophageal wall thickening may be due to reflux, heterogeneous material stomach minimal wall thickening above the greater curvature  CBC    Component Value Date/Time   WBC 15.1 (H) 06/24/2023 1956   RBC 3.53 (L) 06/24/2023 1956   HGB 11.4 (L) 06/24/2023 1956   HGB 16.3 12/08/2018 1104   HCT 33.4 (L) 06/24/2023 1956   HCT 47.5 12/08/2018 1104   PLT 316 06/24/2023 1956   PLT 363 12/08/2018 1104   MCV 94.6 06/24/2023 1956   MCV 94 12/08/2018 1104   MCH 32.3 06/24/2023 1956   MCHC 34.1 06/24/2023 1956   RDW 12.4 06/24/2023 1956   RDW 12.2 12/08/2018 1104   LYMPHSABS 3.7 06/24/2023 1956   LYMPHSABS 2.5 12/08/2018 1104   MONOABS 1.4 (H) 06/24/2023 1956   EOSABS 0.0 06/24/2023 1956   EOSABS 0.1 12/08/2018 1104   BASOSABS 0.0 06/24/2023 1956   BASOSABS 0.0 12/08/2018 1104   Recent Labs    02/07/23 2232 04/21/23 0830 06/24/23 1338 06/24/23 1857 06/24/23 1956  HGB 15.1 14.6 14.7 7.7* 11.4*    CMP     Component Value Date/Time   NA 137 06/24/2023 1338   NA 140 12/08/2018 1104   K 3.3 (L) 06/24/2023 1338    CL 100 06/24/2023 1338   CO2 25 06/24/2023 1338   GLUCOSE 108 (H) 06/24/2023 1338   BUN 20 06/24/2023 1338   BUN 13 12/08/2018 1104   CREATININE 0.81 06/24/2023 1338   CALCIUM 9.5 06/24/2023 1338   PROT 8.3 (H) 06/24/2023 1338   PROT 7.6 12/08/2018 1104   ALBUMIN 4.8 06/24/2023 1338   ALBUMIN 5.3 (H) 12/08/2018 1104   AST 35 06/24/2023 1338   ALT 29 06/24/2023 1338   ALKPHOS 71 06/24/2023 1338   BILITOT 1.3 (H) 06/24/2023 1338   BILITOT 0.7 12/08/2018 1104   GFRNONAA >60 06/24/2023 1338   GFRAA >60 05/14/2019 0445      Latest Ref Rng & Units 06/24/2023    1:38 PM 04/21/2023    8:30 AM 02/07/2023   10:32 PM  Hepatic Function  Total Protein 6.5 - 8.1 g/dL 8.3  7.8  7.8   Albumin 3.5 - 5.0 g/dL 4.8  4.6  4.7   AST 15 - 41 U/L 35  24  21   ALT 0 - 44 U/L 29  19  14    Alk Phosphatase 38 - 126 U/L 71  78  85   Total Bilirubin 0.0 - 1.2 mg/dL 1.3  0.4  0.4       Current Medications:      Current Outpatient Medications (Analgesics):    HYDROcodone-acetaminophen (NORCO/VICODIN) 5-325 MG tablet, Take 1-2 tablets by  mouth every 6 (six) hours as needed for moderate pain (pain score 4-6) or severe pain (pain score 7-10).   Current Outpatient Medications (Other):    diazepam (VALIUM) 5 MG tablet, Take 5 mg by mouth every 6 (six) hours as needed for anxiety.   esomeprazole (NEXIUM) 40 MG capsule, Take 1 capsule (40 mg total) by mouth daily. (Patient not taking: Reported on 07/14/2023)   metoCLOPramide (REGLAN) 10 MG tablet, Take 1 tablet (10 mg total) by mouth every 6 (six) hours as needed for nausea (nausea / vomiting). (Patient not taking: Reported on 07/14/2023)   ondansetron (ZOFRAN) 4 MG tablet, Take 1 tablet (4 mg total) by mouth every 6 (six) hours. 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 Family History:  His family history includes Alzheimer's disease in his maternal grandmother and paternal grandmother; Cancer in his maternal grandfather; Diabetes in his maternal uncle.  REVIEW OF SYSTEMS  : All other systems reviewed and negative except where noted in the History of Present Illness.  PHYSICAL EXAM: BP (!) 104/58   Pulse (!) 106   Ht 5\' 5"  (1.651 m)   Wt 179 lb 9.6 oz (81.5 kg)   SpO2 98%   BMI 29.89 kg/m  Physical Exam   GENERAL APPEARANCE: Well nourished, in no apparent distress. HEENT: No cervical lymphadenopathy, unremarkable thyroid, sclerae anicteric, conjunctiva pink. RESPIRATORY: Respiratory effort normal, breath sounds clear to auscultation bilaterally without rales, rhonchi, or wheezing. CARDIO: Regular rate and rhythm with no murmurs, rubs, or gallops, peripheral pulses intact. ABDOMEN: Soft, non-distended, active bowel sounds in all four quadrants, no tenderness to palpation, no rebound, no mass appreciated. Bruising around umbilicus from surgery, laparoscopic scars healed well, no discharge or redness. RECTAL: Declines. MUSCULOSKELETAL: Full range of motion, normal gait, without edema. SKIN: Dry, intact without rashes or lesions. No jaundice. NEURO: Alert, oriented, no focal deficits. PSYCH: Cooperative, normal mood and affect.      Doree Albee, PA-C 10:47 AM

## 2023-07-14 ENCOUNTER — Ambulatory Visit: Admitting: Physician Assistant

## 2023-07-14 ENCOUNTER — Encounter: Payer: Self-pay | Admitting: Physician Assistant

## 2023-07-14 VITALS — BP 104/58 | HR 106 | Ht 65.0 in | Wt 179.6 lb

## 2023-07-14 DIAGNOSIS — K2100 Gastro-esophageal reflux disease with esophagitis, without bleeding: Secondary | ICD-10-CM

## 2023-07-14 DIAGNOSIS — R1013 Epigastric pain: Secondary | ICD-10-CM | POA: Diagnosis not present

## 2023-07-14 DIAGNOSIS — Z9049 Acquired absence of other specified parts of digestive tract: Secondary | ICD-10-CM

## 2023-07-14 DIAGNOSIS — R1011 Right upper quadrant pain: Secondary | ICD-10-CM

## 2023-07-14 DIAGNOSIS — K760 Fatty (change of) liver, not elsewhere classified: Secondary | ICD-10-CM | POA: Diagnosis not present

## 2023-07-14 DIAGNOSIS — K2289 Other specified disease of esophagus: Secondary | ICD-10-CM

## 2023-07-14 NOTE — Patient Instructions (Addendum)
You have been scheduled for an endoscopy. Please follow written instructions given to you at your visit today. If you use inhalers (even only as needed), please bring them with you on the day of your procedure.   Due to recent changes in healthcare laws, you may see the results of your imaging and laboratory studies on MyChart before your provider has had a chance to review them.  We understand that in some cases there may be results that are confusing or concerning to you. Not all laboratory results come back in the same time frame and the provider may be waiting for multiple results in order to interpret others.  Please give us 48 hours in order for your provider to thoroughly review all the results before contacting the office for clarification of your results.    It was a pleasure to see you today!  Thank you for trusting me with your gastrointestinal care!     

## 2023-08-02 NOTE — Progress Notes (Deleted)
 [facility] History and Physical   Primary Care Physician:  Pcp, No   Reason for Procedure:  Abdominal pain, nausea, CT scan with esophageal thickening  Plan:   EGD     HPI: Kevin Schultz is a 23 y.o. male undergoing EGD for evaluation and management of right upper quadrant abdominal pain, nausea and CT scan finding of distal esophageal wall thickening.  Mr. Kevin Schultz initially presented to the office with symptoms of abdominal pain.  HIDA scan showed a nonfunctioning gallbladder after [facility] he underwent cholecystectomy.  Due to abdominal pain he was reevaluated in the emergency department where a chest CT showed minimal esophageal wall thickening.  Not currently endorsing symptoms of dysphagia.  EGD is performed for further evaluation.   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 Authorizing Provider  diazepam  (VALIUM ) 5 MG tablet Take 5 mg by mouth every 6 (six) hours as needed for anxiety.    [provider]  esomeprazole  (NEXIUM ) 40 MG capsule Take 1 capsule (40 mg total) by mouth daily. Patient not taking: Reported on 07/14/2023 06/24/23   Dalene Duck, MD  HYDROcodone -acetaminophen  (NORCO/VICODIN) 5-325 MG tablet Take 1-2 tablets by mouth every 6 (six) hours as needed for moderate pain (pain score 4-6) or severe pain (pain score 7-10). 06/24/23   Candyce Champagne, MD  metoCLOPramide  (REGLAN ) 10 MG tablet Take 1 tablet (10 mg total) by mouth every 6 (six) hours as needed for nausea (nausea / vomiting). Patient not taking: Reported on 07/14/2023  06/24/23   Candyce Champagne, MD  ondansetron  (ZOFRAN ) 4 MG tablet Take 1 tablet (4 mg total) by mouth every 6 (six) hours. Patient not taking: Reported on 07/14/2023 07/05/23   Denese Finn, PA-C    Current Outpatient Medications  Medication Sig Dispense Refill   diazepam  (VALIUM ) 5 MG tablet Take 5 mg by mouth every 6 (six) hours as needed for anxiety.     esomeprazole  (NEXIUM ) 40 MG capsule Take 1 capsule (40 mg total) by mouth daily. (Patient not taking: Reported on 07/14/2023) 30 capsule 0   HYDROcodone -acetaminophen  (NORCO/VICODIN) 5-325 MG tablet Take 1-2 tablets by mouth every 6 (six) hours as needed for moderate pain (pain score 4-6) or severe pain (pain score 7-10). 30 tablet 0   metoCLOPramide  (REGLAN ) 10 MG tablet Take 1 tablet (10 mg total) by mouth every 6 (six) hours as needed for nausea (nausea / vomiting). (Patient not taking: Reported on 07/14/2023) 20 tablet 3   ondansetron  (ZOFRAN ) 4 MG tablet Take 1 tablet (4 mg total) by mouth every 6 (six) hours. (Patient not taking: Reported on 07/14/2023) 12 tablet 0   No current facility-administered medications for this visit.    Allergies as of 08/06/2023 - Review Complete 07/14/2023  Allergen Reaction Noted   Nsaids Other (See Comments) 06/24/2023    Family History  Problem Relation Age of Onset   Alzheimer's disease Maternal Grandmother    Cancer Maternal Grandfather        type unknown   Alzheimer's disease Paternal Grandmother    Diabetes Maternal Uncle  Colon cancer Neg Hx    Esophageal cancer Neg Hx    Stomach cancer Neg Hx     Social History   Socioeconomic History   Marital status: Single    Spouse name: Not on file   Number of children: 0   Years of education: Not on file   Highest education level: Not on file  Occupational History   Not on file  Tobacco Use   Smoking status: Former    Types: Cigarettes, Cigars   Smokeless tobacco: Never   Tobacco comments:    Black and mild's in high school  Vaping Use    Vaping status: Never Used  Substance and Sexual Activity   Alcohol use: Not Currently    Comment: Social   Drug use: Yes    Types: Marijuana    Comment: Not currently   Sexual activity: Yes  Other Topics Concern   Not on file  Social History Narrative   Not on file   Social Drivers of Health   Financial Resource Strain: Not on file  Food Insecurity: Not on file  Transportation Needs: Not on file  Physical Activity: Not on file  Stress: Not on file  Social Connections: Not on file  Intimate Partner Violence: Not on file    Review of Systems:  All other review of systems negative except as mentioned in the HPI.  Physical Exam: Vital signs There were no vitals taken for this visit.  General:   Alert,  Well-developed, well-nourished, pleasant and cooperative in NAD Airway:  Mallampati  Lungs:  Clear throughout to auscultation.   Heart:  Regular rate and rhythm; no murmurs, clicks, rubs,  or gallops. Abdomen:  Soft, nontender and nondistended. Normal bowel sounds.   Neuro/Psych:  Normal mood and affect. A and O x 3  Eugenia Hess, MD [facility]

## 2023-08-05 ENCOUNTER — Telehealth: Payer: Self-pay | Admitting: Pediatrics

## 2023-08-05 NOTE — Telephone Encounter (Signed)
 Good afternoon Dr. Yvone Herd,   We received a call from this patient wishing to cancel his EGD for tomorrow at 9:00 AM. Patient states he "would not be able to make it" and would call back to reschedule at a later time.   Thank you.

## 2023-08-06 ENCOUNTER — Encounter: Admitting: Pediatrics

## 2024-03-01 ENCOUNTER — Ambulatory Visit: Admission: EM | Admit: 2024-03-01 | Discharge: 2024-03-01 | Disposition: A | Discharge status: Home / Self Care

## 2024-03-01 DIAGNOSIS — S61011A Laceration without foreign body of right thumb without damage to nail, initial encounter: Secondary | ICD-10-CM | POA: Diagnosis not present

## 2024-03-01 DIAGNOSIS — M79644 Pain in right finger(s): Secondary | ICD-10-CM | POA: Diagnosis not present

## 2024-03-01 NOTE — ED Triage Notes (Signed)
 Pt reports he cut the right thumb when he was washing his blender around 1800. Denies pain at this moment.   Pt reports last Tdap 1-1 1/2 year ago.

## 2024-03-01 NOTE — Discharge Instructions (Signed)
 WOUND CARE Please return in 10 days to have your stitches/staples removed or sooner if you have concerns.  Keep area clean and dry.   Tomorrow morning, remove bandage and wash wound gently with mild soap and warm water. Reapply a new bandage after cleaning wound, if directed.  Continue daily cleansing with soap and water until stitches/staples are removed.  Do not apply any ointments or creams to the wound while stitches/staples are in place, as this may cause delayed healing.  Notify the office if you experience any of the following signs of infection: Swelling, redness, pus drainage, streaking, fever >101.0 F  Notify the office if you experience excessive bleeding that does not stop after 15-20 minutes of constant, firm pressure.

## 2024-03-01 NOTE — ED Provider Notes (Signed)
 [facility]  Note:  This document was prepared using Conservation officer, historic buildings and may include unintentional dictation errors.  MRN: [id number] DOB: 2000-05-31  Subjective:   Kevin Schultz is a 23 y.o. male presenting for suffering a right thumb laceration while cleaning his blender at home.  Patient has blood consistently.  Came here to our clinic. Last tdap was updated ~1.5 years ago.   No current facility-administered medications for this encounter.  Current Outpatient Medications:    diazepam  (VALIUM ) 5 MG tablet, Take 5 mg by mouth every 6 (six) hours as needed for anxiety., Disp: , Rfl:    esomeprazole  (NEXIUM ) 40 MG capsule, Take 1 capsule (40 mg total) by mouth daily. (Patient not taking: Reported on 07/14/2023), Disp: 30 capsule, Rfl: 0   HYDROcodone -acetaminophen  (NORCO/VICODIN) 5-325 MG tablet, Take 1-2 tablets by mouth every 6 (six) hours as needed for moderate pain (pain score 4-6) or severe pain (pain score 7-10)., Disp: 30 tablet, Rfl: 0   metoCLOPramide  (REGLAN ) 10 MG tablet, Take 1 tablet (10 mg total) by mouth every 6 (six) hours as needed for nausea (nausea / vomiting). (Patient not taking: Reported on 07/14/2023), Disp: 20 tablet, Rfl: 3   ondansetron  (ZOFRAN ) 4 MG tablet, Take 1 tablet (4 mg total) by mouth every 6 (six) hours. 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   Anxiety    GERD (gastroesophageal reflux disease)      Past Surgical History:  Procedure Laterality Date   CHOLECYSTECTOMY N/A 06/23/2023   Procedure: LAPAROSCOPIC CHOLECYSTECTOMY;  Surgeon: Lyndel Deward PARAS, MD;  Location: [facility];  Service: General;  Laterality: N/A;   WISDOM TOOTH EXTRACTION      Family History  Problem Relation Age of Onset   Alzheimer's disease  Maternal Grandmother    Cancer Maternal Grandfather        type unknown   Alzheimer's disease Paternal Grandmother    Diabetes Maternal Uncle    Colon cancer Neg Hx    Esophageal cancer Neg Hx    Stomach cancer Neg Hx     Social History   Tobacco Use   Smoking status: Former    Types: Cigarettes, Cigars   Smokeless tobacco: Never   Tobacco comments:    Black and mild's in high school  Vaping Use   Vaping status: Never Used  Substance Use Topics   Alcohol use: Yes    Comment: Social   Drug use: Not Currently    Types: Marijuana    Comment: Not currently    ROS   Objective:   Vitals: BP 118/75 (BP Location: Left Arm)   Pulse (!) 114   Temp 98.3 F (36.8 C) (Oral)   Resp 18   SpO2 96%   Physical Exam Constitutional:      General: He is not in acute distress.    Appearance: Normal appearance. He is well-developed and normal weight. He is not ill-appearing, toxic-appearing or diaphoretic.  HENT:     Head: Normocephalic and atraumatic.     Right Ear: External ear normal.     Left Ear: External ear normal.     Nose: Nose normal.     Mouth/Throat:     Pharynx: Oropharynx is clear.  Eyes:     General: No scleral  icterus.       Right eye: No discharge.        Left eye: No discharge.     Extraocular Movements: Extraocular movements intact.  Cardiovascular:     Rate and Rhythm: Normal rate.  Pulmonary:     Effort: Pulmonary effort is normal.  Musculoskeletal:       Hands:     Cervical back: Normal range of motion.  Neurological:     Mental Status: He is alert and oriented to person, place, and time.  Psychiatric:        Mood and Affect: Mood normal.        Behavior: Behavior normal.        Thought Content: Thought content normal.        Judgment: Judgment normal.     PROCEDURE NOTE: laceration repair Verbal consent obtained from patient.  Local anesthesia with 2cc Lidocaine  1% with epinephrine .  Wound explored for tendon, ligament damage. Wound scrubbed  with soap and water and rinsed. Wound closed with #4 5-0 Prolene (simple interrupted) sutures.  Full range of motion prior to and after laceration repair.  Wound cleansed and dressed.   Assessment and Plan :   PDMP not reviewed this encounter.  1. Pain of right thumb   2. Laceration of right thumb without foreign body without damage to nail, initial encounter    Laceration repaired successfully. Wound care reviewed. Recommended Tylenol  and/or ibuprofen for pain control. Return-to-clinic precautions discussed, patient verbalized understanding. Otherwise, follow up in 10 days for suture removal. Counseled patient on potential for adverse effects with medications prescribed/recommended today, ER and return-to-clinic precautions discussed, patient verbalized understanding.    Christopher Savannah, [place] 03/01/24 1949

## 2024-03-07 ENCOUNTER — Ambulatory Visit
Admission: RE | Admit: 2024-03-07 | Discharge: 2024-03-07 | Disposition: A | Discharge status: Home / Self Care | Source: Ambulatory Visit

## 2024-03-07 NOTE — ED Triage Notes (Signed)
 Pt present for Suture removal. Pt states he has not had any issues with sutures.

## 2024-03-07 NOTE — ED Notes (Signed)
 Per Provider, it is too early to remove sutures. Pt will come back 11/28.

## 2024-03-11 ENCOUNTER — Ambulatory Visit
Admission: RE | Admit: 2024-03-11 | Discharge: 2024-03-11 | Disposition: A | Discharge status: Home / Self Care | Source: Ambulatory Visit

## 2024-03-11 DIAGNOSIS — Z4802 Encounter for removal of sutures: Secondary | ICD-10-CM | POA: Diagnosis not present

## 2024-03-11 NOTE — ED Triage Notes (Signed)
 Pt here to get sutures removed from right thumb. The wound is clean, dry and intact. The area is a little erythematous. I notified Johnathan, NP to take a look

## 2024-03-14 ENCOUNTER — Telehealth: Admitting: Emergency Medicine

## 2024-03-14 DIAGNOSIS — Z0289 Encounter for other administrative examinations: Secondary | ICD-10-CM

## 2024-03-14 NOTE — Progress Notes (Signed)
 Virtual Visit Consent   Kevin Schultz, you are scheduled for a virtual visit with a [facility] provider today. Just as with appointments in the office, your consent must be obtained to participate. Your consent will be active for this visit and any virtual visit you may have with one of our providers in the next 365 days. If you have a MyChart account, a copy of this consent can be sent to you electronically.  As this is a virtual visit, video technology does not allow for your provider to perform a traditional examination. This may limit your provider's ability to fully assess your condition. If your provider identifies any concerns that need to be evaluated in person or the need to arrange testing (such as labs, EKG, etc.), we will make arrangements to do so. Although advances in technology are sophisticated, we cannot ensure that it will always work on either your end or our end. If the connection with a video visit is poor, the visit may have to be switched to a telephone visit. With either a video or telephone visit, we are not always able to ensure that we have a secure connection.  By engaging in this virtual visit, you consent to the provision of healthcare and authorize for your insurance to be billed (if applicable) for the services provided during this visit. Depending on your insurance coverage, you may receive a charge related to this service.  I need to obtain your verbal consent now. Are you willing to proceed with your visit today? Kevin Schultz has provided verbal consent on 03/14/2024 for a virtual visit (video or telephone). Jon CHRISTELLA Belt, NP  Date: 03/14/2024 10:47 AM   Virtual Visit via Video Note   I, Jon CHRISTELLA Belt, connected with  Aztlan Coll  ([id number], 2001-04-05) on 03/14/24 at 10:45 AM EST by a video-enabled telemedicine application and verified that I am speaking with the correct person using two identifiers.  Location: Patient: Virtual Visit  Location Patient: Home Provider: Virtual Visit Location Provider: Home Office   I discussed the limitations of evaluation and management by telemedicine and the availability of in person appointments. The patient expressed understanding and agreed to proceed.    History of Present Illness: Kevin Schultz is a 23 y.o. who identifies as a male who was assigned male at birth, and is being seen today for work note. Had laceration of R thumb 11/18, sutures removed 11/28. No pain, swelling, redness. Work will not let him return without a note.   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 (Patient not taking: Reported on 07/14/2023), Disp: 30 capsule, Rfl: 0   HYDROcodone -acetaminophen  (NORCO/VICODIN) 5-325 MG tablet, Take 1-2 tablets by mouth every 6 (six) hours as needed for moderate pain (pain score 4-6) or severe pain (pain score 7-10)., Disp: 30 tablet, Rfl: 0   metoCLOPramide  (REGLAN ) 10 MG tablet, Take 1 tablet (10 mg  total) by mouth every 6 (six) hours as needed for nausea (nausea / vomiting). (Patient not taking: Reported on 07/14/2023), Disp: 20 tablet, Rfl: 3   ondansetron  (ZOFRAN ) 4 MG tablet, Take 1 tablet (4 mg total) by mouth every 6 (six) hours. (Patient not taking: Reported on 07/14/2023), Disp: 12 tablet, Rfl: 0  Observations/Objective: Patient is well-developed, well-nourished in no acute distress.  Resting comfortably  at home.  Head  is normocephalic, atraumatic.  No labored breathing.  Speech is clear and coherent with logical content.  Patient is alert and oriented at baseline.  Anterior R thumb in distal phalanx area with linear, well healing laceration. No open wound. No redness or swelling.   Assessment and Plan: 1. Encounter to obtain excuse from work Scient[employer], [employer])  Will send note ok to return to work  Follow Up Instructions: I discussed the assessment and treatment plan with the patient. The patient was provided an opportunity to ask questions and all were answered. The patient agreed with the plan and demonstrated an understanding of the instructions.  A copy of instructions were sent to the patient via MyChart unless otherwise noted below.   The patient was advised to call back or seek an in-person evaluation if the symptoms worsen or if the condition fails to improve as anticipated.    Jon CHRISTELLA Belt, NP

## 2024-03-14 NOTE — Patient Instructions (Signed)
  Rhodes Saucedo-[name], thank you for joining Jon CHRISTELLA Belt, NP for today's virtual visit.  While this provider is not your primary care provider (PCP), if your PCP is located in our provider database this encounter information will be shared with them immediately following your visit.   A [facility] MyChart account gives you access to today's visit and all your visits, tests, and labs performed at [facility]  click here if you don't have a [facility] MyChart account or go to [email or web address]  Consent: (Patient) Kanden Saucedo-[name] provided verbal consent for this virtual visit at the beginning of the encounter.  Current Medications:  Current Outpatient Medications:    diazepam  (VALIUM ) 5 MG tablet, Take 5 mg by mouth every 6 (six) hours as needed for anxiety., Disp: , Rfl:    esomeprazole  (NEXIUM ) 40 MG capsule, Take 1 capsule (40 mg total) by mouth daily. (Patient not taking: Reported on 07/14/2023), Disp: 30 capsule, Rfl: 0   HYDROcodone -acetaminophen  (NORCO/VICODIN) 5-325 MG tablet, Take 1-2 tablets by mouth every 6 (six) hours as needed for moderate pain (pain score 4-6) or severe pain (pain score 7-10)., Disp: 30 tablet, Rfl: 0   metoCLOPramide  (REGLAN ) 10 MG tablet, Take 1 tablet (10 mg total) by mouth every 6 (six) hours as needed for nausea (nausea / vomiting). (Patient not taking: Reported on 07/14/2023), Disp: 20 tablet, Rfl: 3   ondansetron  (ZOFRAN ) 4 MG tablet, Take 1 tablet (4 mg total) by mouth every 6 (six) hours. (Patient not taking: Reported on 07/14/2023), Disp: 12 tablet, Rfl: 0   Medications ordered in this encounter:  No orders of the defined types were placed in this encounter.    *If you need refills on other medications prior to your next appointment, please contact your pharmacy*  Follow-Up: Call back or seek an in-person evaluation if the symptoms worsen or if the condition fails to improve as anticipated.  [facility] [phone]    If you have been instructed to have an in-person evaluation today at a local Urgent Care facility, please use the link below. It will take you to a list of all of our available [facility], including address, phone number and hours of operation. Please do not delay care.  [facility]  If you or a family member do not have a primary care provider, use the link below to schedule a visit and establish care. When you choose a [facility] primary care physician or advanced practice provider, you gain a long-term partner in health. Find a Primary Care Provider  Learn more about [facility]'s in-office and virtual care options:  - Get Care Now
